# Patient Record
Sex: Female | Born: 1965 | Race: Black or African American | Hispanic: No | Marital: Married | Smoking: Never smoker
Health system: Southern US, Community
[De-identification: ages and names within clinical notes are randomized; demographics above are authoritative.]

## PROBLEM LIST (undated history)

## (undated) DIAGNOSIS — I10 Essential (primary) hypertension: Secondary | ICD-10-CM

## (undated) HISTORY — PX: TUBAL LIGATION: SHX77

## (undated) HISTORY — PX: DILATION AND CURETTAGE OF UTERUS: SHX78

---

## 2000-11-04 ENCOUNTER — Emergency Department (HOSPITAL_COMMUNITY): Admission: EM | Admit: 2000-11-04 | Discharge: 2000-11-04 | Payer: Self-pay

## 2000-11-04 ENCOUNTER — Encounter: Payer: Self-pay | Admitting: Emergency Medicine

## 2000-11-27 ENCOUNTER — Inpatient Hospital Stay (HOSPITAL_COMMUNITY): Admission: AD | Admit: 2000-11-27 | Discharge: 2000-11-27 | Payer: Self-pay | Admitting: Obstetrics

## 2000-11-27 ENCOUNTER — Encounter: Payer: Self-pay | Admitting: Obstetrics

## 2000-12-04 ENCOUNTER — Other Ambulatory Visit: Admission: RE | Admit: 2000-12-04 | Discharge: 2000-12-04 | Payer: Self-pay | Admitting: Obstetrics and Gynecology

## 2000-12-21 ENCOUNTER — Encounter: Payer: Self-pay | Admitting: Obstetrics and Gynecology

## 2000-12-21 ENCOUNTER — Ambulatory Visit (HOSPITAL_COMMUNITY): Admission: RE | Admit: 2000-12-21 | Discharge: 2000-12-21 | Payer: Self-pay | Admitting: Obstetrics and Gynecology

## 2001-01-25 ENCOUNTER — Encounter: Payer: Self-pay | Admitting: Obstetrics and Gynecology

## 2001-01-25 ENCOUNTER — Ambulatory Visit (HOSPITAL_COMMUNITY): Admission: RE | Admit: 2001-01-25 | Discharge: 2001-01-25 | Payer: Self-pay | Admitting: Obstetrics and Gynecology

## 2001-01-30 ENCOUNTER — Inpatient Hospital Stay (HOSPITAL_COMMUNITY): Admission: AD | Admit: 2001-01-30 | Discharge: 2001-01-30 | Payer: Self-pay | Admitting: Obstetrics and Gynecology

## 2001-01-31 ENCOUNTER — Encounter (INDEPENDENT_AMBULATORY_CARE_PROVIDER_SITE_OTHER): Payer: Self-pay | Admitting: Specialist

## 2001-01-31 ENCOUNTER — Encounter: Payer: Self-pay | Admitting: Obstetrics and Gynecology

## 2001-01-31 ENCOUNTER — Observation Stay (HOSPITAL_COMMUNITY): Admission: AD | Admit: 2001-01-31 | Discharge: 2001-02-01 | Payer: Self-pay | Admitting: Obstetrics and Gynecology

## 2001-02-24 ENCOUNTER — Emergency Department (HOSPITAL_COMMUNITY): Admission: EM | Admit: 2001-02-24 | Discharge: 2001-02-24 | Payer: Self-pay | Admitting: Emergency Medicine

## 2001-04-01 ENCOUNTER — Other Ambulatory Visit: Admission: RE | Admit: 2001-04-01 | Discharge: 2001-04-01 | Payer: Self-pay | Admitting: Obstetrics and Gynecology

## 2001-07-23 ENCOUNTER — Inpatient Hospital Stay (HOSPITAL_COMMUNITY): Admission: AD | Admit: 2001-07-23 | Discharge: 2001-07-23 | Payer: Self-pay | Admitting: Obstetrics and Gynecology

## 2001-07-23 ENCOUNTER — Encounter: Payer: Self-pay | Admitting: Obstetrics and Gynecology

## 2001-08-05 ENCOUNTER — Other Ambulatory Visit: Admission: RE | Admit: 2001-08-05 | Discharge: 2001-08-05 | Payer: Self-pay | Admitting: Obstetrics and Gynecology

## 2001-08-09 ENCOUNTER — Ambulatory Visit (HOSPITAL_COMMUNITY): Admission: RE | Admit: 2001-08-09 | Discharge: 2001-08-09 | Payer: Self-pay | Admitting: Obstetrics and Gynecology

## 2001-08-09 ENCOUNTER — Encounter: Payer: Self-pay | Admitting: Obstetrics and Gynecology

## 2001-08-12 ENCOUNTER — Observation Stay (HOSPITAL_COMMUNITY): Admission: RE | Admit: 2001-08-12 | Discharge: 2001-08-13 | Payer: Self-pay | Admitting: Obstetrics and Gynecology

## 2001-09-07 ENCOUNTER — Ambulatory Visit (HOSPITAL_COMMUNITY): Admission: RE | Admit: 2001-09-07 | Discharge: 2001-09-07 | Payer: Self-pay | Admitting: Obstetrics and Gynecology

## 2001-09-07 ENCOUNTER — Encounter: Payer: Self-pay | Admitting: Obstetrics and Gynecology

## 2001-09-22 ENCOUNTER — Ambulatory Visit (HOSPITAL_COMMUNITY): Admission: RE | Admit: 2001-09-22 | Discharge: 2001-09-22 | Payer: Self-pay | Admitting: Obstetrics and Gynecology

## 2001-09-22 ENCOUNTER — Encounter: Payer: Self-pay | Admitting: Obstetrics and Gynecology

## 2001-10-20 ENCOUNTER — Ambulatory Visit (HOSPITAL_COMMUNITY): Admission: RE | Admit: 2001-10-20 | Discharge: 2001-10-20 | Payer: Self-pay | Admitting: Obstetrics and Gynecology

## 2001-10-20 ENCOUNTER — Encounter: Payer: Self-pay | Admitting: Obstetrics and Gynecology

## 2001-11-11 ENCOUNTER — Encounter: Payer: Self-pay | Admitting: Obstetrics and Gynecology

## 2001-11-11 ENCOUNTER — Ambulatory Visit (HOSPITAL_COMMUNITY): Admission: RE | Admit: 2001-11-11 | Discharge: 2001-11-11 | Payer: Self-pay | Admitting: Obstetrics and Gynecology

## 2001-12-05 ENCOUNTER — Inpatient Hospital Stay (HOSPITAL_COMMUNITY): Admission: AD | Admit: 2001-12-05 | Discharge: 2001-12-05 | Payer: Self-pay | Admitting: Obstetrics and Gynecology

## 2001-12-09 ENCOUNTER — Inpatient Hospital Stay (HOSPITAL_COMMUNITY): Admission: AD | Admit: 2001-12-09 | Discharge: 2001-12-09 | Payer: Self-pay | Admitting: Obstetrics and Gynecology

## 2001-12-20 ENCOUNTER — Encounter: Payer: Self-pay | Admitting: Obstetrics and Gynecology

## 2001-12-20 ENCOUNTER — Ambulatory Visit (HOSPITAL_COMMUNITY): Admission: RE | Admit: 2001-12-20 | Discharge: 2001-12-20 | Payer: Self-pay | Admitting: Obstetrics and Gynecology

## 2001-12-21 ENCOUNTER — Inpatient Hospital Stay (HOSPITAL_COMMUNITY): Admission: AD | Admit: 2001-12-21 | Discharge: 2001-12-21 | Payer: Self-pay | Admitting: Obstetrics and Gynecology

## 2001-12-22 ENCOUNTER — Encounter: Admission: RE | Admit: 2001-12-22 | Discharge: 2001-12-22 | Payer: Self-pay | Admitting: Obstetrics and Gynecology

## 2001-12-24 ENCOUNTER — Inpatient Hospital Stay (HOSPITAL_COMMUNITY): Admission: AD | Admit: 2001-12-24 | Discharge: 2001-12-24 | Payer: Self-pay | Admitting: Obstetrics and Gynecology

## 2001-12-28 ENCOUNTER — Inpatient Hospital Stay (HOSPITAL_COMMUNITY): Admission: AD | Admit: 2001-12-28 | Discharge: 2001-12-31 | Payer: Self-pay | Admitting: Obstetrics and Gynecology

## 2002-01-04 ENCOUNTER — Inpatient Hospital Stay (HOSPITAL_COMMUNITY): Admission: AD | Admit: 2002-01-04 | Discharge: 2002-01-04 | Payer: Self-pay | Admitting: Obstetrics and Gynecology

## 2002-03-03 ENCOUNTER — Emergency Department (HOSPITAL_COMMUNITY): Admission: EM | Admit: 2002-03-03 | Discharge: 2002-03-03 | Payer: Self-pay | Admitting: Emergency Medicine

## 2002-12-06 ENCOUNTER — Other Ambulatory Visit: Admission: RE | Admit: 2002-12-06 | Discharge: 2002-12-06 | Payer: Self-pay | Admitting: Family Medicine

## 2004-03-27 ENCOUNTER — Encounter: Admission: RE | Admit: 2004-03-27 | Discharge: 2004-03-27 | Payer: Self-pay | Admitting: Family Medicine

## 2004-06-21 ENCOUNTER — Emergency Department (HOSPITAL_COMMUNITY): Admission: EM | Admit: 2004-06-21 | Discharge: 2004-06-21 | Payer: Self-pay | Admitting: Emergency Medicine

## 2005-06-21 ENCOUNTER — Emergency Department (HOSPITAL_COMMUNITY): Admission: EM | Admit: 2005-06-21 | Discharge: 2005-06-21 | Payer: Self-pay | Admitting: Emergency Medicine

## 2005-08-21 ENCOUNTER — Ambulatory Visit (HOSPITAL_COMMUNITY): Admission: RE | Admit: 2005-08-21 | Discharge: 2005-08-21 | Payer: Self-pay | Admitting: Family Medicine

## 2005-12-05 ENCOUNTER — Ambulatory Visit: Payer: Self-pay | Admitting: Family Medicine

## 2006-01-29 ENCOUNTER — Ambulatory Visit: Payer: Self-pay | Admitting: Internal Medicine

## 2006-02-04 ENCOUNTER — Emergency Department (HOSPITAL_COMMUNITY): Admission: EM | Admit: 2006-02-04 | Discharge: 2006-02-04 | Payer: Self-pay | Admitting: Emergency Medicine

## 2006-02-17 ENCOUNTER — Ambulatory Visit (HOSPITAL_COMMUNITY): Admission: RE | Admit: 2006-02-17 | Discharge: 2006-02-17 | Payer: Self-pay | Admitting: Emergency Medicine

## 2006-03-17 ENCOUNTER — Ambulatory Visit: Payer: Self-pay | Admitting: Family Medicine

## 2006-05-26 ENCOUNTER — Inpatient Hospital Stay (HOSPITAL_COMMUNITY): Admission: EM | Admit: 2006-05-26 | Discharge: 2006-05-27 | Payer: Self-pay | Admitting: Emergency Medicine

## 2006-05-27 ENCOUNTER — Encounter: Payer: Self-pay | Admitting: Cardiovascular Disease

## 2006-09-01 ENCOUNTER — Emergency Department (HOSPITAL_COMMUNITY): Admission: EM | Admit: 2006-09-01 | Discharge: 2006-09-01 | Payer: Self-pay | Admitting: Emergency Medicine

## 2006-10-23 ENCOUNTER — Ambulatory Visit (HOSPITAL_COMMUNITY): Admission: RE | Admit: 2006-10-23 | Discharge: 2006-10-23 | Payer: Self-pay | Admitting: Obstetrics

## 2007-04-19 ENCOUNTER — Ambulatory Visit (HOSPITAL_COMMUNITY): Admission: RE | Admit: 2007-04-19 | Discharge: 2007-04-19 | Payer: Self-pay | Admitting: Obstetrics

## 2007-04-27 ENCOUNTER — Ambulatory Visit (HOSPITAL_COMMUNITY): Admission: RE | Admit: 2007-04-27 | Discharge: 2007-04-27 | Payer: Self-pay | Admitting: Obstetrics

## 2007-05-04 ENCOUNTER — Ambulatory Visit (HOSPITAL_COMMUNITY): Admission: RE | Admit: 2007-05-04 | Discharge: 2007-05-04 | Payer: Self-pay | Admitting: Obstetrics

## 2007-05-12 ENCOUNTER — Ambulatory Visit (HOSPITAL_COMMUNITY): Admission: RE | Admit: 2007-05-12 | Discharge: 2007-05-12 | Payer: Self-pay | Admitting: Obstetrics

## 2007-05-18 ENCOUNTER — Ambulatory Visit (HOSPITAL_COMMUNITY): Admission: RE | Admit: 2007-05-18 | Discharge: 2007-05-18 | Payer: Self-pay | Admitting: Obstetrics

## 2007-06-01 ENCOUNTER — Ambulatory Visit (HOSPITAL_COMMUNITY): Admission: RE | Admit: 2007-06-01 | Discharge: 2007-06-01 | Payer: Self-pay | Admitting: Obstetrics

## 2007-06-08 ENCOUNTER — Ambulatory Visit (HOSPITAL_COMMUNITY): Admission: RE | Admit: 2007-06-08 | Discharge: 2007-06-08 | Payer: Self-pay | Admitting: Obstetrics

## 2007-07-30 ENCOUNTER — Inpatient Hospital Stay (HOSPITAL_COMMUNITY): Admission: AD | Admit: 2007-07-30 | Discharge: 2007-08-03 | Payer: Self-pay | Admitting: Obstetrics

## 2007-07-31 ENCOUNTER — Encounter: Payer: Self-pay | Admitting: Obstetrics

## 2007-10-09 ENCOUNTER — Emergency Department (HOSPITAL_COMMUNITY): Admission: EM | Admit: 2007-10-09 | Discharge: 2007-10-09 | Payer: Self-pay | Admitting: Emergency Medicine

## 2007-12-08 ENCOUNTER — Inpatient Hospital Stay (HOSPITAL_COMMUNITY): Admission: AD | Admit: 2007-12-08 | Discharge: 2007-12-08 | Payer: Self-pay | Admitting: Obstetrics

## 2008-05-08 ENCOUNTER — Ambulatory Visit (HOSPITAL_COMMUNITY): Admission: RE | Admit: 2008-05-08 | Discharge: 2008-05-08 | Payer: Self-pay | Admitting: Obstetrics

## 2008-06-06 ENCOUNTER — Emergency Department (HOSPITAL_COMMUNITY): Admission: EM | Admit: 2008-06-06 | Discharge: 2008-06-06 | Payer: Self-pay | Admitting: Emergency Medicine

## 2008-07-27 ENCOUNTER — Encounter: Admission: RE | Admit: 2008-07-27 | Discharge: 2008-07-27 | Payer: Self-pay | Admitting: Internal Medicine

## 2008-09-16 ENCOUNTER — Inpatient Hospital Stay (HOSPITAL_COMMUNITY): Admission: AD | Admit: 2008-09-16 | Discharge: 2008-09-16 | Payer: Self-pay | Admitting: Obstetrics & Gynecology

## 2008-10-06 ENCOUNTER — Other Ambulatory Visit: Admission: RE | Admit: 2008-10-06 | Discharge: 2008-10-06 | Payer: Self-pay | Admitting: Internal Medicine

## 2008-12-29 ENCOUNTER — Ambulatory Visit: Payer: Self-pay | Admitting: *Deleted

## 2009-01-15 ENCOUNTER — Encounter (INDEPENDENT_AMBULATORY_CARE_PROVIDER_SITE_OTHER): Payer: Self-pay | Admitting: Internal Medicine

## 2009-01-15 ENCOUNTER — Ambulatory Visit: Payer: Self-pay | Admitting: Family Medicine

## 2009-01-15 LAB — CONVERTED CEMR LAB
BUN: 14 mg/dL (ref 6–23)
Chloride: 105 meq/L (ref 96–112)
Glucose, Bld: 91 mg/dL (ref 70–99)
Potassium: 4 meq/L (ref 3.5–5.3)

## 2009-03-23 ENCOUNTER — Emergency Department (HOSPITAL_COMMUNITY): Admission: EM | Admit: 2009-03-23 | Discharge: 2009-03-23 | Payer: Self-pay | Admitting: Family Medicine

## 2009-10-09 ENCOUNTER — Ambulatory Visit (HOSPITAL_COMMUNITY): Admission: RE | Admit: 2009-10-09 | Discharge: 2009-10-09 | Payer: Self-pay | Admitting: Internal Medicine

## 2009-12-02 IMAGING — US US OB TRANSVAGINAL
1 series · 14 of 28 positions shown · non-contrast
Comparison: none

OBSTETRICAL ULTRASOUND:
 This ultrasound was performed in The [HOSPITAL], and the [facility] report will be stored to [REDACTED] PACS.

[Series 1: us ob transvaginal · 14 of 32 slices shown]
[im 2/32]
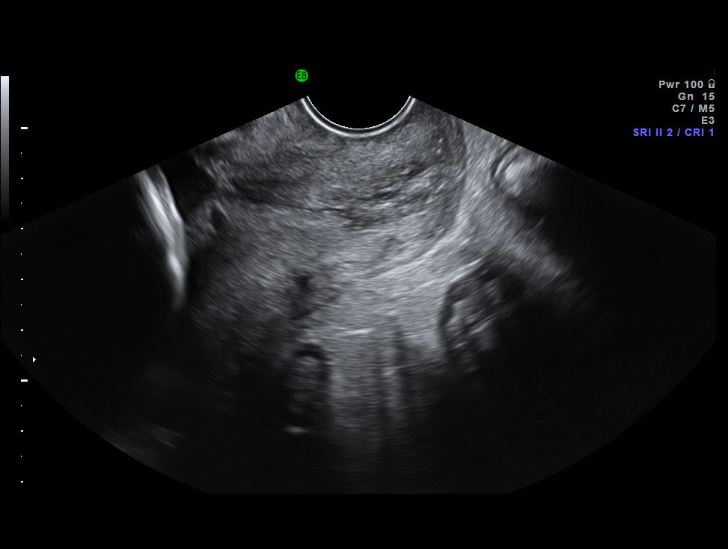
[im 4/32]
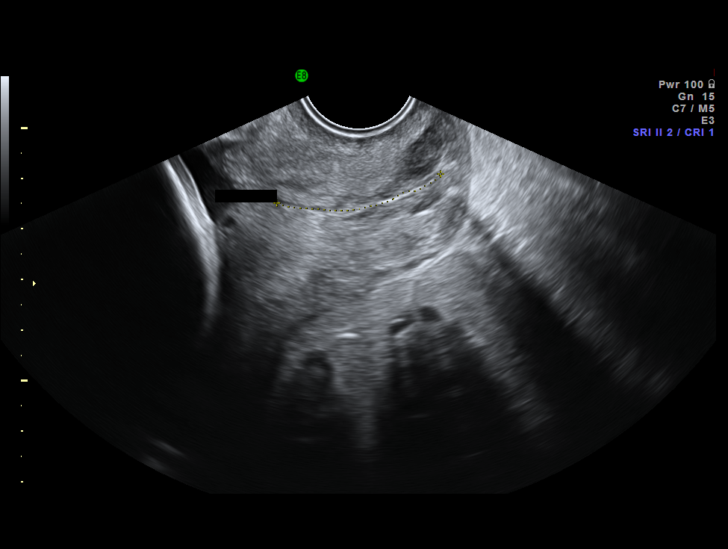
[im 6/32]
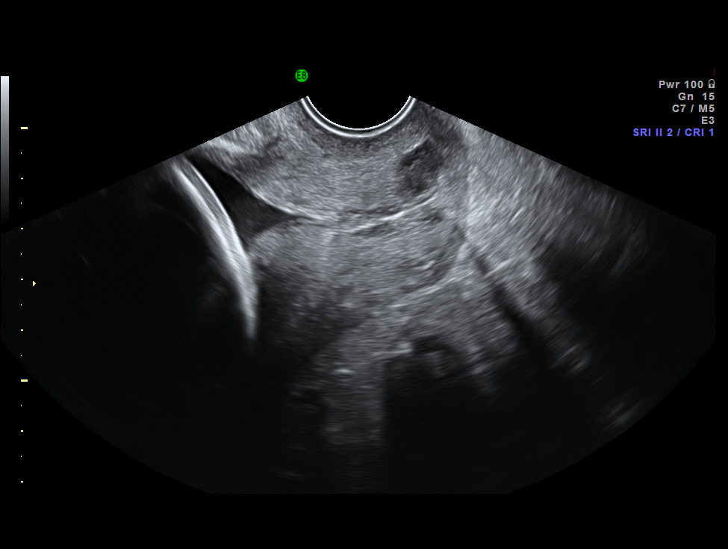
[im 9/32]
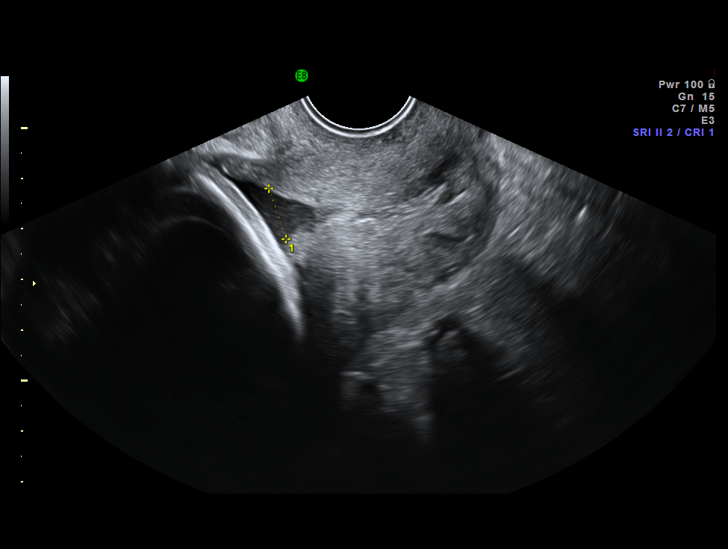
[im 11/32]
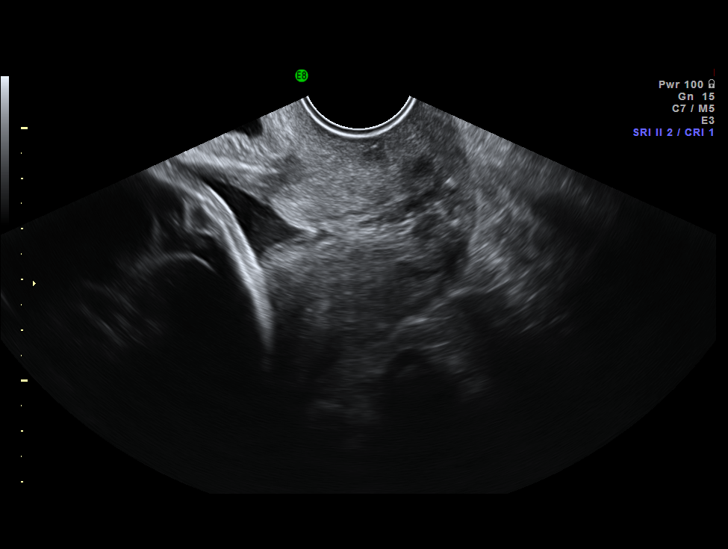
[im 13/32]
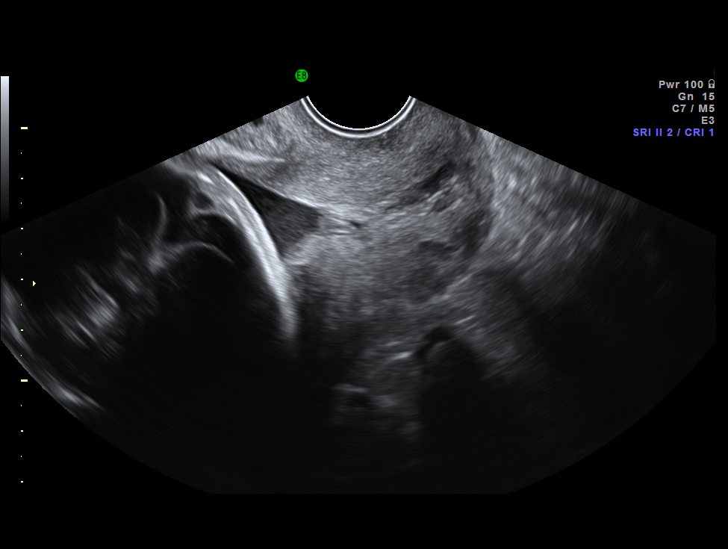
[im 15/32]
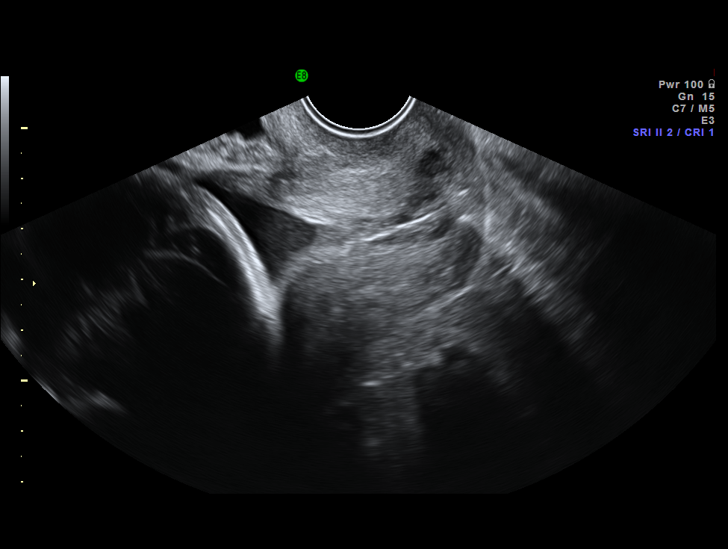
[im 18/32]
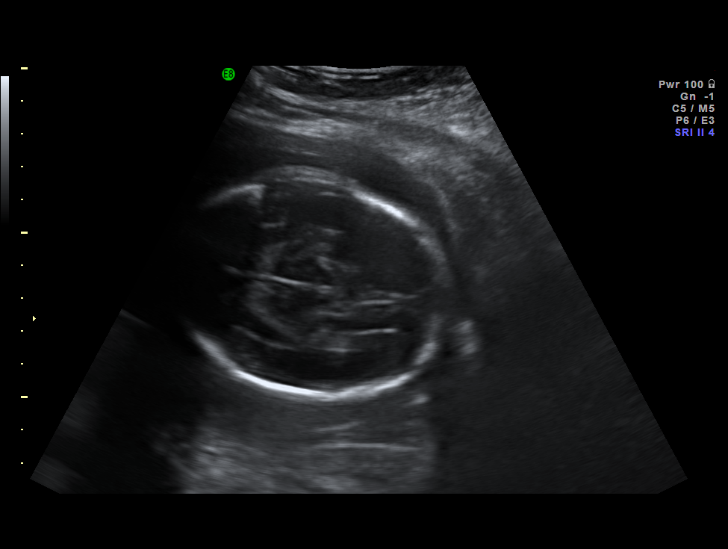
[im 20/32]
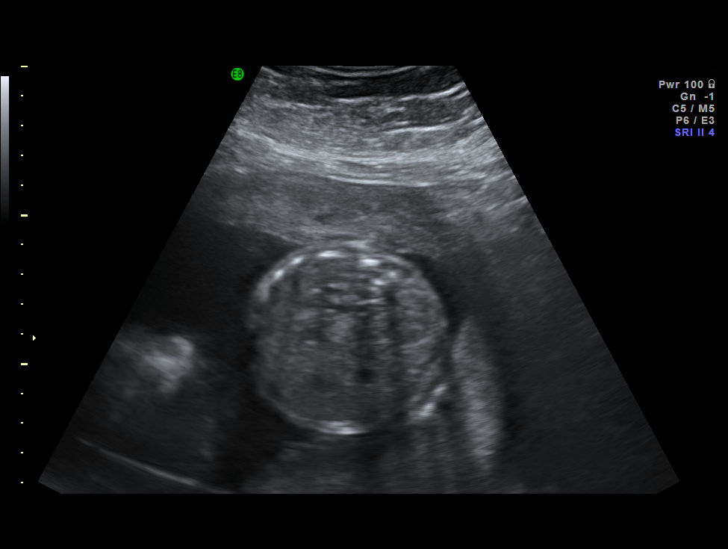
[im 22/32]
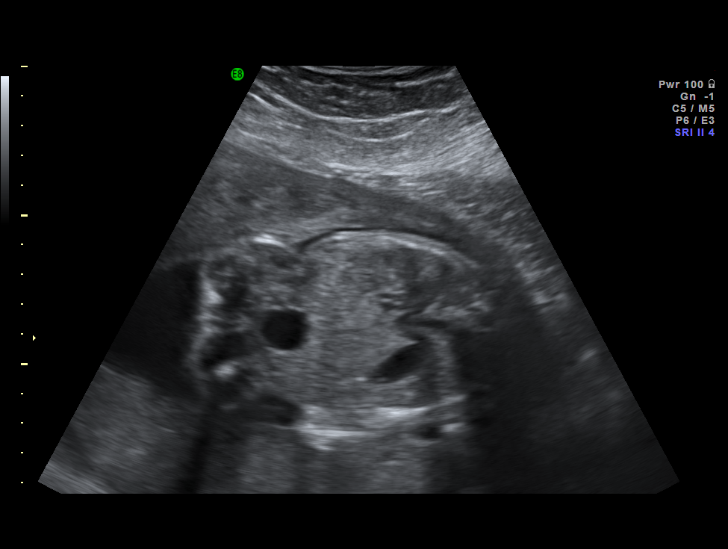
[im 25/32]
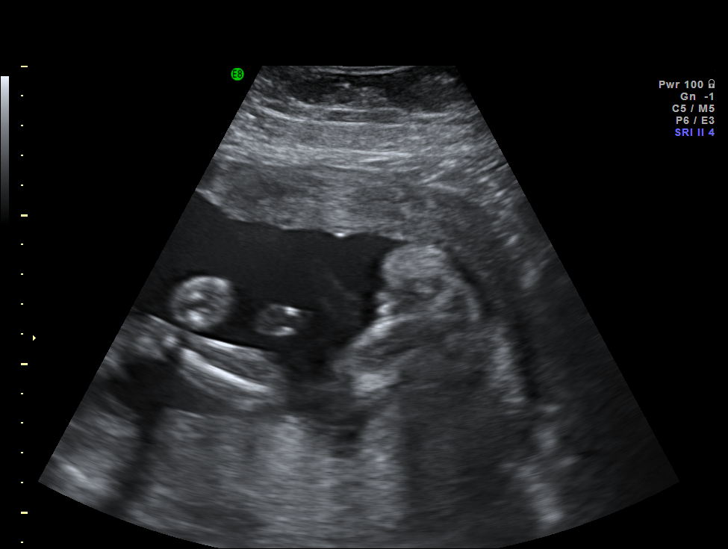
[im 27/32]
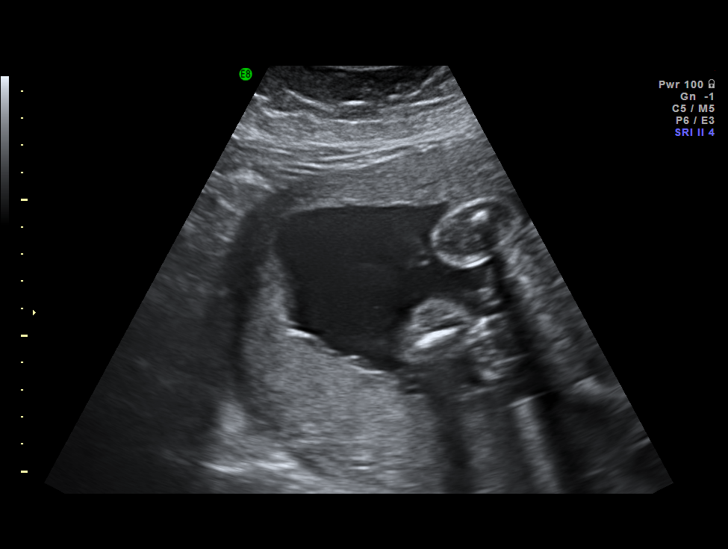
[im 29/32]
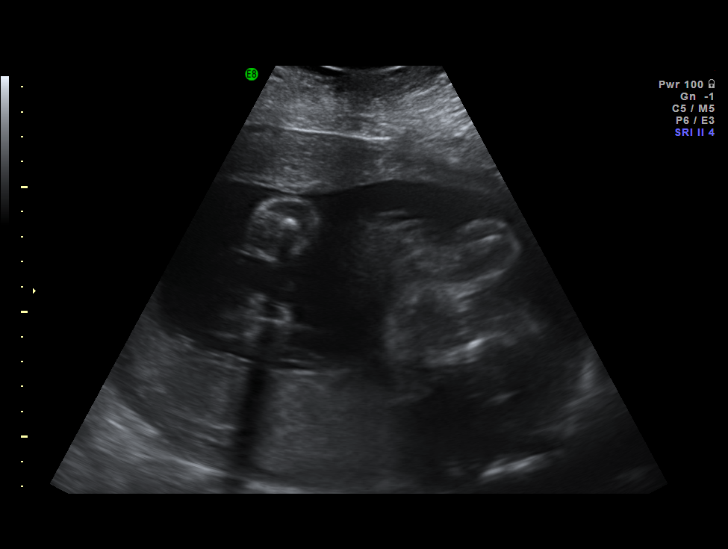
[im 32/32]
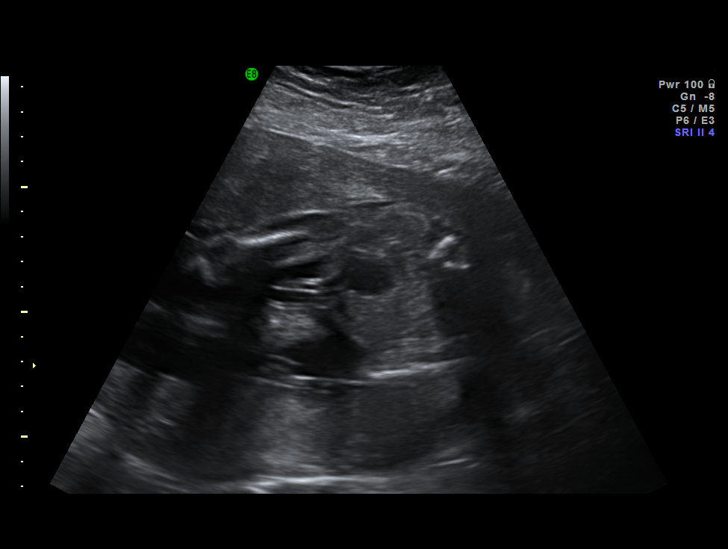

[14 of 28 positions shown; findings below may reference images not displayed]

IMPRESSION: The [facility] report has also been faxed to the ordering physician.

## 2010-04-16 ENCOUNTER — Inpatient Hospital Stay (HOSPITAL_COMMUNITY)
Admission: AD | Admit: 2010-04-16 | Discharge: 2010-04-16 | Payer: Self-pay | Source: Home / Self Care | Attending: Obstetrics & Gynecology | Admitting: Obstetrics & Gynecology

## 2010-05-05 ENCOUNTER — Encounter: Payer: Self-pay | Admitting: Obstetrics

## 2010-05-06 ENCOUNTER — Other Ambulatory Visit: Payer: Self-pay

## 2010-05-06 ENCOUNTER — Ambulatory Visit
Admission: RE | Admit: 2010-05-06 | Discharge: 2010-05-06 | Payer: Self-pay | Source: Home / Self Care | Attending: Obstetrics & Gynecology | Admitting: Obstetrics & Gynecology

## 2010-05-06 ENCOUNTER — Encounter: Payer: Self-pay | Admitting: Obstetrics

## 2010-05-07 NOTE — Progress Notes (Unsigned)
NAMEJENENE, [name]            ACCOUNT NO.:  [id number]  MEDICAL RECORD NO.:  [id number]          PATIENT TYPE:  WOC  LOCATION:  [facility]                   FACILITY:  [facility]  PHYSICIAN:  Wynelle Bourgeois, CNM    DATE OF BIRTH:  Jun 25, 1965  DATE OF SERVICE:  05/06/2010                                 CLINIC NOTE  HISTORY OF PRESENT ILLNESS:  The patient is a 45 year old G5, P2-0-3-2 who presents today for routine well-woman exam after being seen in the MAU on April 16, 2010, to rule out pregnancy.  The patient's last period was April 09, 2010, it was normal.  She is status post BTL after her last pregnancy which was 2 years ago.  Today, the patient does endorse some lower extremity edema that began after she stopped taking her HCTZ approximately 1 month ago, otherwise she has no complaints today.  HEALTH MAINTENANCE:  Last mammogram was in 2011, and per patient it was normal.  Last Pap smear was in June of 2010, per the patient she has had no abnormal Pap smears in the past.  PAST MEDICAL HISTORY:  Significant for hypertension, previously treated with HCTZ, but has not taken medicine approximately one month due to health concern status.  PAST GYNECOLOGICAL HISTORY:  No history of STDs, no history of abnormal Pap smears.  PAST OBSTETRICAL HISTORY:  Patient has had 2 C-sections.  She has also had 2 cerclages for cervical incompetence.  Her two living children were both born at term.  PAST SURGICAL HISTORY:  Cesarean section x2.  FAMILY HISTORY:  Positive for hypertension in mother.  SOCIAL HISTORY:  No smoking, alcohol, or drug use.  Patient is currently unemployed, but hoping to start a new job __________.  PHYSICAL EXAMINATION:  VITAL SIGNS:  Temperature 99.5, pulse 87, blood pressure 129/85, weight 255.6 pounds. CARDIOVASCULAR:  Normal sinus rate and rhythm.  No murmurs. PULMONARY:  Clear to auscultation bilaterally. ABDOMEN:  Soft, nontender.  No masses.  Positive  for bowel sounds. BREASTS:  No external lesions noted.  No masses noted. SPECULUM EXAM:  Normal external genitalia.  Cervix normal without discharge. PELVIC:  Revealed no masses or tenderness. EXTREMITIES:  Lower extremities, minimal edema bilaterally.  Pedal pulses present bilaterally.  ASSESSMENT:  A 45 year old G5, P2-0-3-2, presents today for annual exam.  PLAN:  Pap smear completed at today's visit.  Prescription written for 1 month 25 mg of hydrochlorothiazide.  Advised the patient to contact [facility] for primary care management.  She may return here in 1 year for routine exam or sooner if she has any problems.  We also counseled the patient regarding her recent MAU visit and her concern that she might have been pregnant, although the urine pregnancy test was negative in the MAU.  We reassured the patient that she could continue to take at home urine pregnancy test once in a month if she was still worried that she may be pregnant, but given her history of bilateral tubal ligation and recent normal periods, the likelihood of pregnancy is very low.    ______________________________ Lubertha Basque, PA student   ______________________________ Wynelle Bourgeois, CNM   AG/MEDQ  D:  05/06/2010  T:  05/07/2010  Job:  [id number]

## 2010-06-24 LAB — POCT PREGNANCY, URINE: Preg Test, Ur: NEGATIVE

## 2010-06-24 LAB — URINALYSIS, ROUTINE W REFLEX MICROSCOPIC
Glucose, UA: NEGATIVE mg/dL
Ketones, ur: NEGATIVE mg/dL
Protein, ur: NEGATIVE mg/dL

## 2010-06-24 LAB — URINE MICROSCOPIC-ADD ON

## 2010-08-27 NOTE — Discharge Summary (Signed)
[name], [name]            ACCOUNT NO.:  [id number]   MEDICAL RECORD NO.:  [id number]          PATIENT TYPE:  INP   LOCATION:  9120                          [facility]   PHYSICIAN:  Charles A. Clearance Coots, M.D.DATE OF BIRTH:  31-May-1965   DATE OF ADMISSION:  07/30/2007  DATE OF DISCHARGE:  08/03/2007                               DISCHARGE SUMMARY   ADMITTING DIAGNOSES:  1. A 39 weeks' gestation, active labor.  2. Previous cesarean section.  3. Desired trial of labor.  4. Desired permanent sterilization.   DISCHARGE DIAGNOSES:  1. A 39 weeks' gestation, active labor.  2. Previous cesarean section.  3. Desired trial of labor.  4. Desired permanent sterilization.  5. Status post repeat low transverse cesarean section and bilateral      partial salpingectomy on July 31, 2007, for persistent late fetal      heart rate decelerations.  Viable female was delivered at 0746.      Apgars of 8 at 1 minute, 9 at 5 minutes, weight of 9 pounds 12      ounces.  Cord pH of 7.19, length of 52.83 cm.  Mother and infant      discharged to home in good condition.   REASON FOR ADMISSION:  A 45 year old G5, P61, black female, estimated  date of confinement August 06, 2007, presents with uterine contractions.  The patient has a history of cervical incompetence and was on  progesterone therapy until 35 weeks' gestation.  She presented with  complaint of uterine contractions.   PAST MEDICAL HISTORY:  Surgery: Cesarean section.  Illnesses:  None.   MEDICATIONS:  Prenatal vitamins and Delalutin.   ALLERGIES:  No known drug allergies.   SOCIAL HISTORY:  Married.  Negative tobacco, alcohol, or recreational  drug use.   PHYSICAL EXAMINATION:  GENERAL:  Well-nourished, well-developed female  in no acute distress, afebrile.  VITAL SIGNS:  Stable.  LUNGS:  Clear to auscultation bilaterally.  HEART:  Regular rate and rhythm.  ABDOMEN:  Gravid, nontender.  Cervix 5 cm dilated, 90% effacement  and  vertex at -2 station.   ADMITTING LABORATORY VALUES:  Hemoglobin 12, hematocrit 34.7, white  blood cell count 5000, platelets 220,000.  RPR was nonreactive.   HOSPITAL COURSE:  The patient was admitted and had no progress in labor  for greater than 3 hours of active labor, but the fetal heart rate  revealed persistent fetal heart rate, late onset decelerations that  became worse over time despite intrauterine pressure catheter and  amnioinfusion oxygen and position changes.  The patient was having  adequate labor by intrauterine pressure catheter measurements, but was  making no progress in labor with a very high station.  A decision was  made to proceed with repeat cesarean section delivery for persistent  late fetal heart rate decelerations.  The patient desired permanent  sterilization, repeat low transverse cesarean section and bilateral  partial salpingectomy was performed on July 31, 2007.  There were no  intraoperative complications.  Postoperative course was uncomplicated.  The patient was discharged home on postop day #3 in good condition.  DISCHARGE LABS:  Hemoglobin 10, hematocrit 30, white blood cell count  9,800, platelets 202,000.   DISCHARGE DISPOSITION/MEDICATIONS:  Tylox and ibuprofen was prescribed  for pain.  Continue prenatal vitamins.  Routine written instructions  were given for discharge after cesarean section.  The patient is to call  office for a followup appointment in 2 weeks.      Charles A. Clearance Coots, M.D.  Electronically Signed     CAH/MEDQ  D:  08/03/2007  T:  08/03/2007  Job:  [id number]

## 2010-08-27 NOTE — Op Note (Signed)
[name], [name]            ACCOUNT NO.:  [id number]   MEDICAL RECORD NO.:  [id number]          PATIENT TYPE:  INP   LOCATION:  9120                          [facility]   PHYSICIAN:  Charles A. Clearance Coots, M.D.DATE OF BIRTH:  1966-02-16   DATE OF PROCEDURE:  07/31/2007  DATE OF DISCHARGE:                               OPERATIVE REPORT   PREOPERATIVE DIAGNOSIS:  Persistent late decels, previous cesarean  section, desires sterilization.   POSTOPERATIVE DIAGNOSIS:  Persistent late decels, previous cesarean  section, desires sterilization.   PROCEDURE:  Repeat low transverse cesarean section and bilateral partial  salpingectomy.   SURGEON:  Brock Bad, MD   ASSISTANT:  Lysle Pearl, OR tech.   ANESTHESIA:  Epidural.   ESTIMATED BLOOD LOSS:  1000 mL.   IV FLUIDS:  1600 mL.   URINE OUTPUT:  50 mL clear.   COMPLICATIONS:  None.   DRAINS:  Foley to gravity.   FINDINGS:  Viable female at 0746 hours.  Apgars of 8 at one minute and 9  at five minutes.  Weight of 9 pounds and 12 ounces.  Cord pH of 7.19.  Normal uterus, ovaries and fallopian tubes.   SPECIMEN:  Approximately 2 cm segments of right and left fallopian  tubes.   DISPOSITION OF SPECIMEN:  Pathology.   PROCEDURE IN DETAIL:  The patient was brought to the operating room  after satisfactory redosing of the epidural the abdomen was prepped and  draped in the usual sterile fashion.  A Pfannenstiel skin incision was  made through the previous scar with a scalpel down to the fascia.  The  fascia was nicked in the midline.  The fascial incision was extended to  the left and to the right with curved Mayo scissors.  The superior and  inferior fascial edges were taken off of the rectus muscles both blunt  and sharp dissection.  The rectus muscle was then divided bluntly in the  midline.  The peritoneum was entered digitally and was digitally  extended to the left and to the right.  The Alexis retractor was then  placed in the incision and the vesicouterine fold of peritoneum was  grasped with forceps and was incised and undermined with Metzenbaum  scissors.  The incision was extended to the left and to the right with  the Metzenbaum scissors.  The bladder flap was then bluntly developed  and the uterus was then entered transversely in the lower uterine  segment with a scalpel.  Clear amniotic fluid was expelled.  The uterine  incision was then extended to the left and to the right with the bandage  scissors.  The vertex was then brought up into the incision and the  occiput was rotated anteriorly and the vertex was then delivered with  the aid of fundal pressure from the assistant.  The infant's mouth and  nose were suctioned with a suction bulb and the delivery was completed  with the aid of fundal pressure from the assistant.  The umbilical cord  was doubly clamped and cut and the infant was handed off to the nursery  staff.  The placenta was spontaneously expelled from the uterine cavity  intact.  The endometrial surface was thoroughly debrided with a dry lap  sponge.  The edges of the uterine incision were then grasped with ring  forceps and the uterus was closed with a continuous interlocking suture  of zero Monocryl from each corner to the center.  Hemostasis was  excellent.   Attention was then turned to the tubal ligation procedure.  The left  fallopian tube was grasped with a Babcock clamp and was identified from  the corneal end to the fimbrial end and then grasped with a Babcock  clamp.  The knuckle of tube beneath the Babcock clamp in the isthmic  area of the tube was then suture ligated with zero plain catgut and the  section of tube above the knot was excised with Metzenbaum scissors and  submitted to pathology for evaluation.  There was no active bleeding  from the tubal stump and it was therefore placed back in its normal  anatomic position.  The same procedure was performed on  the opposite  side without complications.  The abdomen was then closed as follows:  The peritoneum was closed with a continuous suture of zero Monocryl.  The muscle was approximated with a continuous suture of zero Monocryl.  The fascia was closed with a continuous suture of zero PDS from each  corner to the center.  Subcutaneous tissue was thoroughly irrigated with  warm saline solution.  All areas of subcutaneous bleeding were  coagulated with the Bovie.  The subcutaneous tissue was then  approximated with a continuous suture of zero plain catgut.  The skin  was then closed with a continuous subcuticular suture of 3-0 Monocryl.  A sterile bandage was applied to the incision closure.  The surgical  technician indicated that all needle, sponge and instrument counts were  correct x2.  The patient tolerated the procedure well and was  transported to the recovery room in satisfactory condition.      Charles A. Clearance Coots, M.D.  Electronically Signed     CAH/MEDQ  D:  07/31/2007  T:  07/31/2007  Job:  [id number]

## 2010-08-30 NOTE — H&P (Signed)
[facility]  Patient:    [name], [name] Visit Number: [id number] MRN: [id number]          Service Type: MED Location: 9300 [id number] Attending Physician:  Shaune Spittle Dictated by:   Mack Guise, C.N.M. Admit Date:  01/31/2001 Discharge Date: 02/01/2001                           History and Physical  HISTORY OF PRESENT ILLNESS:   Ms. Brossard is a 45 year old, gravida 3, para 0-0-2-0, who presents at 19 weeks with spontaneous AB. She was seen in the Maternity Admissions Unit on yesterday, October 19, following rupture of membranes at approximately 1615 for clear fluid. She did not labor at that time. Ultrasound evaluation done yesterday, October 19, found a 19-week pregnancy with leaking fluid. The baby did have positive cardiac activity with fetal heart rate at 137 beats per minute. The cervix measured 3.2 cm and there was no discernible, no measurable amniotic fluid. Discussion was entered into with the patient regarding the inevitable threatened AB and patient and spouse did make the decision to go home and proceed with expectant management. She presents today with bleeding and cramping which started at approximately 1545. There is no discernible heart beat today and on speculum exam, the babys feet are noted at the cervical os.  The patients pregnancy has been followed by the MD service at [facility] and is remarkable for a history of two second trimester losses, one at 16 weeks and one at 22 weeks.  LABORATORY DATA:              Prenatal lab work on December 04, 2000 found hemoglobin and hematocrit 11.9 and 34.5. Today, hemoglobin and hematocrit are 11.2 and 32.3. Platelets on prenatal lab work were 243,000 and today they are 274,000. Todays white blood cell count is 8.8. Hepatitis B surface antigen is negative. Rubella immune. RPR is nonreactive. Blood type is B positive, antibody screen negative, sickle cell trait negative and  HIV nonreactive. Comprehensive metabolic panel done today finds sodium 138, potassium 3.6, chloride 103, carbon dioxide 25, glucose 103, BUN 8, creatinine 0.7, calcium 9.1, total protein 7.6, albumin 3.2, SGOT 21, SGPT 14, alkaline phosphatase 57, and total bilirubin 0.3.  PAST MEDICAL HISTORY:         Unremarkable.  OBSTETRICAL HISTORY:          Significant for two second trimester losses, one at 16 weeks and one at 22 weeks, in 1983 and 2001 with D&E performed following both of those. She does not have any significant medical history nor any surgical history.  FAMILY HISTORY:               Unremarkable.  SOCIAL HISTORY:               Husband is present and supportive. She denies the use of tobacco, alcohol, or illicit drugs.  ALLERGIES:                    No known drug allergies.  REVIEW OF SYSTEMS:            Patient is visibly upset and crying.  Bleeding and cramping are noted. The patient is miscarrying as described above.  PHYSICAL EXAMINATION:  VITAL SIGNS:                  Vital signs are stable. Temperature 97.3, pulse 102, respirations 24, and  blood pressure 144/68.  HEENT:                        Unremarkable.  NECK:                         Thyroid not enlarged.  HEART:                        Regular rate and rhythm.  LUNGS:                        Clear.  ABDOMEN:                      Soft and nontender in between contractions. There are no fetal heart tones.  PELVIC:                       On speculum exam, a small amount of blood and clots are noted in the vagina and fetal feet are noted at the cervical os which does appear closed.  ASSESSMENT:                   1. Intrauterine pregnancy at 19 weeks.                               2. Spontaneous incomplete abortion.  PLAN:                         Admit per Dr. Pennie Rushing and Dr. Pennie Rushing to follow. ictated by:   Mack Guise, C.N.M. Attending Physician:  Shaune Spittle DD:  01/31/01 TD:   02/01/01 Job: [id number]

## 2010-08-30 NOTE — Discharge Summary (Signed)
[facility]  Patient:    [name], [name] Visit Number: [id number] MRN: [id number]          Service Type: MED Location: [phone] Attending Physician:  Shaune Spittle Dictated by:   Saverio Danker, C.N.M. Admit Date:  01/31/2001 Discharge Date: 02/01/2001                             Discharge Summary  ADMISSION DIAGNOSES:          1. Intrauterine pregnancy at 19 weeks with                                  spontaneous rupture of the membranes on                                  January 30, 2001.                               2. Complete spontaneous abortion.                               3. Diagnosis of incompetent cervix by                                  obstetrical history.  DISCHARGE DIAGNOSES:          1. Intrauterine pregnancy at 19 weeks with                                  spontaneous rupture of membranes on                                  January 30, 2001.                               2. Complete spontaneous abortion.                               3. Diagnosis of incompetent cervix by                                  obstetrical history.                               4. Desires conception as soon as possible.  PROCEDURES THIS ADMISSION:    Spontaneous delivery of a 19 week nonviable fetus with spontaneous delivery of placenta also.  This delivery was attended by Dr. Dierdre Forth on January 31, 2001 with no immediate complications.  HOSPITAL COURSE:              [name] is a 45 year old, Gravida 3, Para 0-0-2-0 with history of two previous second trimester loses who presented with spontaneous rupture of the membranes on January 30, 2001  and elected at that time to go home and follow expectant management. She returns on January 31, 2001 complaining of contractions and vaginal spotting and bleeding and was noted to be in the process of miscarrying a 19 week fetus.  She completed the delivery at  approximately early evening of January 31, 2001 attended by Dr. Dierdre Forth.  The fetus was nonviable and appeared consistent with approximately [redacted] weeks gestation.  Her vital signs are stable.  She is afebrile.  Her bleeding is minimal and she is deemed ready for discharge on post delivery day one.  DISCHARGE INSTRUCTIONS:       She is to call for fever greater than 100.4 or for significant vaginal bleeding or as needed.  DISCHARGE MEDICATIONS:        1. Motrin 600 mg p.o. q.6h. p.r.n. pain.                               2. Prenatal vitamins daily.  DISCHARGE LABORATORY DATA:    Her hemoglobin is 10.1, WBC 8.1, platelets 241. She had an EKG that shows normal sinus rhythm.  DISCHARGE FOLLOW UP:          Will be in approximately two weeks at [facility] or p.r.n. Dictated by:   Vance Gather Duplantis, C.N.M. Attending Physician:  Shaune Spittle DD:  02/01/01 TD:  02/03/01 Job: [id number]

## 2010-08-30 NOTE — Op Note (Signed)
[facility]  Patient:    [name], [name] Visit Number: [id number] MRN: [id number]          Service Type: DSU Location: 9300 [id number] Attending Physician:  Jaymes Graff A Dictated by:   Pierre Bali Normand Sloop, M.D. Proc. Date: 08/12/01 Admit Date:  08/12/2001 Discharge Date: 08/13/2001                             Operative Report  PREOPERATIVE DIAGNOSES:       Intrauterine pregnancy at 14+ weeks with incompetent cervix.  POSTOPERATIVE DIAGNOSES:      Intrauterine pregnancy at 14+ weeks with incompetent cervix with some spotting as told by patient earlier today.  PROCEDURE:                    McDonald cerclage.  SURGEON:                      Naima A. Normand Sloop, M.D.  ANESTHESIA:                   Spinal.  ESTIMATED BLOOD LOSS:         50 cc.  INTRAVENOUS FLUIDS:           1500 cc Crystalloid.  URINE OUTPUT:                 Clear at the end of procedure.  COMPLICATIONS:                None.  FINDINGS:                     A 14 week sized uterus with fetal heart tones seen before the procedure on ultrasound.  She had scant blood in the vault seen even before the procedure was commenced and her cervix was found to be closed, 40%, and high.  No adnexal masses palpated.  PROCEDURE IN DETAIL:          Patient was taken to the operating room where she was given spinal anesthesia, placed in the dorsal lithotomy position. Foley catheter was placed into the bladder and weighted.  Retractor was placed in the posterior portion of the vagina and two skinny Deavers were also used for retraction.  There was a scant amount of blood seen in the vault.  Cervix was noted to be closed and 40% effaced.  The bleeding was thought to maybe come from some effacement.  The anterior and posterior lips of the cervix were grasped with ring forceps.  A McDonald cerclage was done using Mersilene without difficulty.  Hemostasis was noted at end of procedure and there  was noted to be no leakage of fluid.  Urine was clear and rectal examination was within normal limits.  All instruments were then removed from the vagina. Patient went to recovery room in stable condition.  The patient reported spotting earlier today before we took her to the operating room and then an ultrasound which showed a viable fetus with movement and good cardiac activity.  Due to the earlier spotting and her history, we will watch her for 23 hour observation. Dictated by:   Pierre Bali. Normand Sloop, M.D. Attending Physician:  Michael Litter DD:  08/12/01 TD:  08/15/01 Job: [id number]

## 2010-08-30 NOTE — Discharge Summary (Signed)
NAME:  [name], [name]                      ACCOUNT NO.:  [id number]   MEDICAL RECORD NO.:  [id number]                   PATIENT TYPE:  INP   LOCATION:  9131                                 FACILITY:  [facility]   PHYSICIAN:  [name], M.D.              DATE OF BIRTH:  Nov 18, 1965   DATE OF ADMISSION:  12/28/2001  DATE OF DISCHARGE:  12/31/2001                                 DISCHARGE SUMMARY   ADMITTING DIAGNOSES:  1. Preterm intrauterine pregnancy at 34 weeks.  2. Breech presentation.  3. Premature rupture of membranes.  4. Cervical cerclage in place secondary to history of incompetent cervix.   DISCHARGE DIAGNOSES:  1. Preterm intrauterine pregnancy at 34 weeks.  2. Breech presentation.  3. Premature rupture of membranes.  4. Cercivical cerclage in place secondary to history of incompetent cervix.   PROCEDURE:  1. Primary low transverse cesarean section.  2. Cerclage removal.   HOSPITAL COURSE:  The patient is a 45 year old gravida 4, para 0-0-3-0 who  was admitted on December 28, 2001 at 42 and 1/7 weeks with spontaneous  rupture of membranes.  She was having some contractions every three to four  minutes, but was not uncomfortable.  The patient's history was remarkable  for cerclage in place secondary to three previous second trimester losses,  advanced maternal age with amniocentesis declined, positive group B Strep,  history of fibroids, obesity, first trimester spotting, history of abnormal  Pap, questionable LGA on last ultrasound.  She was noted to be leaking clear  fluid on admission.  Cervical examination was deferred.  Cervix appeared to  be closed by visual examination.  Fetus was noted to be breech by bed side  ultrasound.  Fetal heart rate was reassuring.  Dr. Stefano Bruce was consulted  and the decision was made to proceed with cesarean section secondary to  breech presentation, cerclage in place, and premature rupture of the  membranes.  The patient was  taken to the operating room by Dr. Stefano Bruce  where the cervical cerclage was removed without difficulty.  He was assisted  by Dr. Osborn Bruce.  The patient had spinal anesthesia.  Findings were a  viable female by the name of [name].  Apgars 9 and 9.  Weight 7.6 ounces from  a breech presentation.  There were no complications.  Estimated blood loss  was 800 cc.  The patient was taken to the recovery room in good condition.  Infant was taken to the full-term nursery in good condition.  By  postoperative day one patient was doing well.  She was pumping her breast  milk.  She planned IUD for contraception.  Her physical examination was  within normal limits.  Her incision was clean, dry, and intact, although  there was significant panniculus noted.  Hemoglobin on postoperative day one  was 9.6 down from 11.1 pre delivery.  WBC count was 7.7 and platelets were  191,000.  The  rest of the patient's hospital course was uncomplicated.  The  patient's baby remained in regular nursery.  By postoperative day three  patient was doing well.  She was pumping for colostrum.  Her physical  examination was within normal limits.  Her incision was clean, dry, and  intact.  Her fundus was firm.  Dr. Normand Bruce was consulted and decision was  made to maintain the patient's staples until Monday at which time they would  be taken out in the office.  The patient was then deemed to have received  full benefit of her hospital stay and was discharged home.   DISCHARGE INSTRUCTIONS:  Per [facility] handout.   DISCHARGE MEDICATIONS:  1. Motrin 600 mg p.o. q.6h. p.r.n. pain.  2. Tylox one to two p.o. q.3-4h. p.r.n. pain.   DISCHARGE FOLLOWUP:  Will occur in approximately five weeks at [facility] with the patient's plan for IUD to be placed at approximately  six weeks after pelvic cultures were returned.     Renaldo Reel [name], C.N.M.                   Caroline A. [name], M.D.    Leeanne Mannan  D:   12/31/2001  T:  01/03/2002  Job:  [id number]

## 2010-08-30 NOTE — Discharge Summary (Signed)
[name], [name]            ACCOUNT NO.:  [id number]   MEDICAL RECORD NO.:  [id number]          PATIENT TYPE:  INP   LOCATION:  1401                         FACILITY[facility]  [facility]   PHYSICIAN:  Ricki Rodriguez, M.D.  DATE OF BIRTH:  21-Jan-1966   DATE OF ADMISSION:  05/26/2006  DATE OF DISCHARGE:  05/27/2006                               DISCHARGE SUMMARY   DISCHARGE SUMMARY:  Patient admitted by Dr. Ricki Rodriguez, M.D.   PRINCIPAL DIAGNOSES:  1. Noncardiac chest pain.  2. Reflux esophagitis.   DISCHARGE MEDICATIONS:  1. Prilosec 20 mg 1-2 daily.  2. Nortrel 7/7/7 one daily as directed.   DISCHARGE DIET:  Low-fat, low-salt diet.   DISCHARGE ACTIVITIES:  Patient to increase activities slowly.   FOLLOWUP:  By Dr. Orpah Cobb in two weeks.  Patient to call [phone]  for appointment.   HISTORY:  This is a 45 year old black female complaining of retrosternal  chest pain off and on since one day, lasting for 3-4 hours.   PHYSICAL EXAMINATION:  VITAL SIGNS:  Temperature 97.1, pulse 56,  respirations 18, blood pressure 155/87, height 5 feet 8 inches, weight  257 pounds.  GENERAL:  Patient is well developed, well-nourished black female in no  acute distress.  HEENT:  Patient is normocephalic, atraumatic, has brown eyes.  Conjunctivae pink.  Sclerae nonicteric.  NECK:  No JVD.  No carotid bruits.  LUNGS:  Clear bilaterally.  CHEST WALL:  Nontender.  HEART:  Normal S1 and S2.  ABDOMEN:  Soft, distended but nontender.  EXTREMITIES:  Trace edema.  SKIN:  Warm and dry.  CNS:  Patient moves all four extremities.   LABORATORY DATA:  Normal WBC count, platelet count, slightly low  hemoglobin at 11.8, hematocrit 34.6.  INR of 1.0.  Electrolytes normal.  BUN 9, creatinine 0.68.  CPK total elevated, but MB normal and troponin  I normal x3.  Cholesterol level 164 with a triglyceride of 154, HDL  cholesterol of 36 and LDL cholesterol of 97.   EKG: Normal sinus rhythm with nonspecific  T-wave changes.   Chest x-ray revealed borderline cardiomegaly with no active pulmonary  disease.   CT of the chest without evidence of pulmonary embolism.   Nuclear stress test:  Normal myocardial perfusion with ejection fraction  of 56%.   HOSPITAL COURSE:  Patient was admitted to telemetry unit.  Myocardial  infarction was ruled out.  She underwent a nuclear stress test that  failed to show reversible ischemia.  Hence patient was treated for  noncardiac chest pain and discharged home in satisfactory condition with  followup by me in two weeks.      Ricki Rodriguez, M.D.  Electronically Signed     ASK/MEDQ  D:  09/24/2006  T:  09/24/2006  Job:  [id number]

## 2010-08-30 NOTE — Discharge Summary (Signed)
[facility]  Patient:    [name], [name] Visit Number: [id number] MRN: [id number]          Service Type: DSU Location: 9300 [id number] Attending Physician:  Jaymes Graff A Dictated by:   Nigel Bridgeman, C.N.M. Admit Date:  08/12/2001 Discharge Date: 08/13/2001                             Discharge Summary  ADMITTING DIAGNOSES:          1. Intrauterine pregnancy at 14-1/2 weeks.                               2. Incompetent cervix.  POSTOPERATIVE DIAGNOSES:      1. Intrauterine pregnancy at 14-1/2 weeks.                               2. Incompetent cervix.                               3. Spotting.  PROCEDURES:                   1. McDonald cerclage.                               2. Spinal anesthesia.  HOSPITAL COURSE:              The patient is a 45 year old gravida 4, para 0-0-3-0 who presented at 14-3/7 weeks for cervical cerclage secondary to her history of three second trimester losses with a history of premature rupture of membranes and chorioamnionitis.  The patient was taken to the operating room, where a McDonald cervical cerclage was placed by Dr. Normand Sloop under spinal anesthesia.  The patient had essentially an uncomplicated procedure. There was a slight amount of blood in the vault before the procedure.  The cervix was closed and 40% with the presenting part very high.  The morning after the procedure, the patient was doing well.  She was on Motrin.  Fetal heart rate was in the 140s.  She was deemed to receive full benefit of her hospital stay and was discharged home by Dr. Normand Sloop.  DISCHARGE INSTRUCTIONS:       The patient to be on level 2 bedrest and pelvic rest.  She is to call for any bleeding, leaking, or significant abdominal pain.  DISCHARGE MEDICATIONS:        1. Motrin 600 mg p.o. q.6h. on a p.r.n. basis.                               2. The patient is also on Indocin for 24 hours.  FOLLOWUP:                      Discharge followup will occur in one week at the office for a followup appointment. Dictated by:   Nigel Bridgeman, C.N.M. Attending Physician:  Michael Litter DD:  08/13/01 TD:  08/16/01 Job: [id number]

## 2010-08-30 NOTE — H&P (Signed)
[facility]  Patient:    [name], [name] Visit Number: [id number] MRN: [id number]          Service Type: OBS Location: [facility] Attending Physician:  Jaymes Graff A Dictated by:   Pierre Bali Normand Sloop, M.D. Admit Date:  07/23/2001 Discharge Date: 07/23/2001                           History and Physical  HISTORY OF PRESENT ILLNESS:   The patient is a 45 year old G4, P0-0-3-0, whose last menstrual period was May 03, 2001, with estimated due date of February 07, 2002, consistent with 11+ week ultrasound, and presenting at 14-3/7 weeks today for cervical cerclage due to incompetent cervix.  The patient has a history of three second trimester losses where she had preterm rupture of membranes and preterm labor with chorioamnionitis.  PAST OBSTETRIC HISTORY:       In August 1983 she had a preterm labor female at six months.  In November 2001 she had preterm labor, rupture of membranes, female at four months.  On January 30, 2001, had a female preterm, rupture of membranes and preterm labor, at 18 weeks.  None of the infants lived.  PAST GYNECOLOGIC HISTORY:     Significant for an abnormal Pap with low-grade SIL.  She had a colposcopy done in December 2002, which was consistent with low-grade SIL.  PAST MEDICAL HISTORY:         Unremarkable.  PAST SURGICAL HISTORY:        Significant for a D&C with her first pregnancy.  ALLERGIES:                    No known drug allergies.  SOCIAL HISTORY:               Negative x3.  PHYSICAL EXAMINATION:  VITAL SIGNS:                  The patient is 253 pounds.  Her blood pressure is 110/70.  CHEST:                        Lungs are clear.  CARDIAC:                      Heart is regular.  ABDOMEN:                      Obese, soft, and nontender.  PELVIC:                       Cervix was long and closed per CNM.  The uterus was difficult to tell the size secondary to obesity.  IMPRESSION:                    The patient is 14 weeks with incompetent cervix, desires cervical cerclage.  Understands the risks are but not limited to bleeding, infection, damage to internal organs such as bladder.  Patients GC, Chlamydia cultures were negative in October 2, and she is B positive, antibody negative. Dictated by:   Pierre Bali. Normand Sloop, M.D. Attending Physician:  Michael Litter DD:  08/12/01 TD:  08/12/01 Job: [id number]

## 2010-08-30 NOTE — H&P (Signed)
NAME:  [name], [name]                      ACCOUNT NO.:  [id number]   MEDICAL RECORD NO.:  [id number]                   PATIENT TYPE:  INP   LOCATION:  9198                                 FACILITY:  [facility]   PHYSICIAN:  Janine Limbo, M.D.            DATE OF BIRTH:  05-07-1965   DATE OF ADMISSION:  12/28/2001  DATE OF DISCHARGE:                                HISTORY & PHYSICAL   HISTORY OF PRESENT ILLNESS:  The patient is a 45 year old gravida 4, para 0-  0-3-0, at 34-1/7 weeks, who presents today with spontaneous rupture of  membranes at approximately 5:30 this morning.  She notes clear fluid.  She  is aware of some mild contractions, but she is not uncomfortable with them.   The patient's history is remarkable for:  1. Cerclage in place secondary to 3 previous second trimester losses.  2. Advanced maternal age with amnio declined.  3. Positive group B strep.  4. History of fibroids.  5. Obesity.  6. First trimester spotting.  7. History of abnormal Pap.  8. Questionable LGA on last ultrasound.   PRENATAL LABS:  Blood type is B positive.  Rh antibody negative.  VDRL  nonreactive.  Rubella titer positive.  Hepatitis B surface antigen negative.  HIV nonreactive.  Sickle cell test was negative.  GC cultures were negative.  Pap was normal.  Glucose challenge was normal.  Patient declined AFP.  EDC  of 02/07/02 was established by last menstrual period and was in agreement  with ultrasound at approximately 11-6/7 weeks.  Hemoglobin upon entry into  practice was 11.3, it was 10.8 at 26 weeks.   HISTORY OF PRESENT PREGNANCY:  The patient appeared at approximately 13  weeks wit her history of 3 previous premature rupture of membranes and  preterm labor losses.  Plan was made for cervical cerclage on May 1.  This  was placed by Dr. Normand Sloop.  She was placed on modified bed rest from that  time.  She had an ultrasound done at 20 weeks that showed normal fluid and  growth.   Cervix was 3.2 cm on ultrasound at 20 weeks after the cerclage.  She had another ultrasound on July 9 at 24 weeks, showing a cervix of 2 cm.  Cervix was rechecked at 25 weeks with cervix 2.5 cm on ultrasound.  She  began to measure size greater than dates early in her pregnancy by at least  20 weeks.  She had ultrasound at 29 weeks that showed a breech presentation,  normal fluid, and estimated fetal weight greater than the 95th percentile.  She was placed on iron.  Fasting blood sugar was checked and was within  normal limits.  The rest of her pregnancy was essentially uncomplicated.  She was seen in the office yesterday on 12/27/01 with cervix closed and  stitch intact.   OBSTETRICAL HISTORY:  In 1983, she had a vaginal birth of a female  infant at 6  months gestation secondary to preterm labor; that infant did pass away.  In  November of 2001, she had a vaginal delivery of a 55-month gestation female  with premature ruptured membranes and preterm labor, probable  chorioamnionitis.  In October of 2002, she had a vaginal birth of an 102 to  11 week female fetus after premature ruptured membranes and preterm labor with  probable chorio.  She had to have a D&C after the first pregnancy for  retained placenta.   MEDICAL HISTORY:  She had a history of an abnormal Pap and low-grade SIL in  the past.  She reports the usual childhood illnesses.  Her only other  surgery was the [facility] following her first delivery in 60.   ALLERGIES:  The patient has no known medication allergies.   FAMILY HISTORY:  Her mother has hypertension.  Her sister and brother have  diabetes of some type.   GENETIC HISTORY:  Remarkable for the patient being 36 at the time of  delivery with amnio and AFP declined.   SOCIAL HISTORY:  The patient is married, father of the baby is involved and  supportive.  He is not currently with her since he was at work; he had gone  to work before her water broke.  We had not been able to  contact him at the  time when she went to surgery.  The patient is unemployed.  She denies any  alcohol, drug, or tobacco use during this pregnancy.  She has been followed  by the Physician Service at [facility].   PHYSICAL EXAMINATION:  VITAL SIGNS:  Stable, the patient is afebrile.  HEENT:  Within normal limits.  LUNGS:  Clear.  HEART:  Regular rate and rhythm without murmur.  BREASTS:  Soft and nontender.  ABDOMEN:  Fundal height is approximately 40 cm.  Estimated fetal weight is 7  to 7-1/2 pounds by last ultrasound.  Uterine contractions are every 2 to 6  minutes, mild quality.  Speculum exam reveals positive frond, positive  pooling, positive nitrazine.  Fetus is also breech by bedside ultrasound.  Fetal heart rate is reassuring with slightly decreased variability, but no  decelerations were noted.  EXTREMITIES:  Deep tendon reflexes are 2+ without clonus.  There is a trace  edema noted.   IMPRESSION:  1. Intrauterine pregnancy at 34-1/7 weeks.  2. Premature rupture of the membranes.  3. Patient with cerclage in place secondary to incompetent cervix.  4. Breech presentation.  5. Uterine contractions.  6. Positive group B strep.   PLAN:  1. Admit to the [facility] for consult with Dr. Marline Backbone as attending physician.  2. Plan to proceed with primary low transverse Cesarean section secondary to     breech presentation, spontaneous rupture of membranes and uterine     contractions with a history of a cerclage.  3. Risks and benefits of surgery were reviewed with the patient by Dr.     Stefano Gaul, including bleeding, damage to other organs and infection, as     well as failure of anesthesia method.  4. Dr. Stefano Gaul will plan to take the cerclage out in the OR prior to     beginning the surgery.     [name], C.N.M.                   Janine Limbo, M.D.    VLL/MEDQ  D:  12/28/2001  T:  12/28/2001  Job:  [id number]

## 2010-08-30 NOTE — Op Note (Signed)
NAMEJERE, [name]                      ACCOUNT NO.:  [id number]   MEDICAL RECORD NO.:  [id number]                   PATIENT TYPE:  INP   LOCATION:  9198                                 FACILITY:  [facility]   PHYSICIAN:  Osborn Coho, M.D.                DATE OF BIRTH:  1966-02-05   DATE OF PROCEDURE:  12/28/2001  DATE OF DISCHARGE:                                 OPERATIVE REPORT   PREOPERATIVE DIAGNOSES:  1. Preterm intrauterine pregnancy.  2. Breech presentation.  3. Rupture of membranes.   POSTOPERATIVE DIAGNOSES:  1. Preterm intrauterine pregnancy.  2. Breech presentation.  3. Rupture of membranes.   PROCEDURE:  Primary low transverse cesarean section via Pfannenstiel skin  incision.   SURGEON:  Osborn Coho, M.D.   ASSISTANT:  Janine Limbo, M.D.   ANESTHESIA:  Spinal.   FLUIDS:  3200 cc.   ESTIMATED BLOOD LOSS:  800 cc.   URINE OUTPUT:  200 cc.   COMPLICATIONS:  None.   FINDINGS:  Live female infant named [name] with Apgars of 9 at one minute and  9 at five minutes delivered via breech presentation.   PROCEDURE:  The patient was taken to the operating room after risks,  benefits, and alternatives discussed with patient and consent signed and  witnessed.  The patient was given a spinal and placed in the dorsal supine  position with a leftward tilt.  The patient was prepped and cerclage stitch  removed from the anterior lip of the cervix after Foley was placed under  sterile conditions.  The patient was draped in the normal sterile fashion.  A Pfannenstiel skin incision was made and carried down to the underlying  layer of fascia with the Bovie.  The fascia was excised with the Bovie and  extended laterally with the Mayo scissors.  Kocher clamps were placed on the  inferior aspect of the fascial incision and the rectus muscle excised from  the fascia.  The same was done on the superior aspect of the fascial  incision.  The muscle was separated in  the midline manually and the  peritoneum entered bluntly.  A bladder flap was created with the Metzenbaum  scissors.  Bladder blade was used to protect the bladder and uterine  incision made and extended laterally with the bandage scissors.  The infant  was found to be in the breech presentation and delivered via breech  extraction without difficulty.  The infant was bulb suctioned and the cord  clamped and cut.  The infant was handed off to the waiting pediatricians.  Cord blood was sent.  The uterine incision was repaired using 0 Vicryl on  the first layer and a running locked stitch was performed.  An embrocating  layer was performed using 2-0 Vicryl.  The ovaries and tubes bilaterally  were identified and appeared normal.  Irrigation was performed.  The  peritoneal incision was reapproximated using 2-0  Vicryl.  The fascia was  repaired with 0 Vicryl in a running fashion.  The skin was closed with  staples.  The patient tolerated the procedure well and was returned to the  recovery room in stable condition.                                               Osborn Coho, M.D.    AR/MEDQ  D:  12/28/2001  T:  12/28/2001  Job:  [id number]

## 2010-10-28 ENCOUNTER — Other Ambulatory Visit (HOSPITAL_COMMUNITY): Payer: Self-pay | Admitting: Internal Medicine

## 2010-12-03 ENCOUNTER — Other Ambulatory Visit (HOSPITAL_COMMUNITY): Payer: Self-pay | Admitting: Internal Medicine

## 2010-12-03 DIAGNOSIS — Z1231 Encounter for screening mammogram for malignant neoplasm of breast: Secondary | ICD-10-CM

## 2010-12-05 ENCOUNTER — Ambulatory Visit (HOSPITAL_COMMUNITY)
Admission: RE | Admit: 2010-12-05 | Discharge: 2010-12-05 | Disposition: A | Discharge status: Home / Self Care | Payer: Self-pay | Source: Ambulatory Visit | Attending: Internal Medicine | Admitting: Internal Medicine

## 2010-12-05 DIAGNOSIS — Z1231 Encounter for screening mammogram for malignant neoplasm of breast: Secondary | ICD-10-CM

## 2011-01-07 LAB — RPR: RPR Ser Ql: NONREACTIVE

## 2011-01-07 LAB — CBC
HCT: 30.3 — ABNORMAL LOW
Hemoglobin: 12
MCHC: 34.4
MCV: 79.9
Platelets: 202
RBC: 3.79 — ABNORMAL LOW
RBC: 4.3
WBC: 9.8

## 2011-01-09 LAB — CBC
HCT: 39.3
Hemoglobin: 13
RBC: 4.88
WBC: 4.5

## 2011-01-09 LAB — DIFFERENTIAL
Basophils Absolute: 0
Lymphocytes Relative: 34
Lymphs Abs: 1.5
Monocytes Absolute: 0.3
Monocytes Relative: 6
Neutro Abs: 2.6

## 2011-01-09 LAB — URINALYSIS, ROUTINE W REFLEX MICROSCOPIC
Nitrite: NEGATIVE
Specific Gravity, Urine: 1.02
Urobilinogen, UA: 0.2
pH: 6

## 2011-01-09 LAB — BASIC METABOLIC PANEL
Calcium: 9.2
GFR calc Af Amer: >60
GFR calc non Af Amer: >60
Potassium: 3.9
Sodium: 139

## 2011-01-09 LAB — PREGNANCY, URINE: Preg Test, Ur: NEGATIVE

## 2011-05-31 ENCOUNTER — Ambulatory Visit (HOSPITAL_COMMUNITY)
Admission: RE | Admit: 2011-05-31 | Discharge: 2011-05-31 | Disposition: A | Discharge status: Home / Self Care | Source: Ambulatory Visit | Attending: Family Medicine | Admitting: Family Medicine

## 2011-05-31 DIAGNOSIS — M79609 Pain in unspecified limb: Secondary | ICD-10-CM | POA: Insufficient documentation

## 2011-05-31 DIAGNOSIS — M79669 Pain in unspecified lower leg: Secondary | ICD-10-CM

## 2011-06-02 NOTE — Progress Notes (Signed)
VASCULAR LAB PRELIMINARY  PRELIMINARY  PRELIMINARY  PRELIMINARY  Right lower extremity venous duplex completed 05/31/11   Preliminary report:  Right:  No evidence of DVT, superficial thrombosis, or Baker's cyst.  Aleksey Newbern D, RVS 06/02/2011, 12:35 PM

## 2011-08-16 ENCOUNTER — Ambulatory Visit: Payer: Self-pay | Admitting: Emergency Medicine

## 2011-08-16 VITALS — BP 139/90 | HR 71 | Temp 98.4°F | Resp 18 | Ht 64.0 in | Wt 243.8 lb

## 2011-08-16 DIAGNOSIS — I10 Essential (primary) hypertension: Secondary | ICD-10-CM

## 2011-08-16 LAB — POCT CBC
Granulocyte percent: 69.6 %G (ref 37–80)
MCV: 77.4 fL — AB (ref 80–97)
MID (cbc): 0.3 (ref 0–0.9)
MPV: 9 fL (ref 0–99.8)
POC Granulocyte: 3.8 (ref 2–6.9)
POC LYMPH PERCENT: 24.8 %L (ref 10–50)
POC MID %: 5.6 %M (ref 0–12)
Platelet Count, POC: 274 10*3/uL (ref 142–424)
RDW, POC: 18.5 %

## 2011-08-16 LAB — COMPREHENSIVE METABOLIC PANEL
AST: 14 U/L (ref 0–37)
Albumin: 3.9 g/dL (ref 3.5–5.2)
Alkaline Phosphatase: 69 U/L (ref 39–117)
BUN: 17 mg/dL (ref 6–23)
Potassium: 4.1 mEq/L (ref 3.5–5.3)

## 2011-08-16 MED ORDER — HYDROCHLOROTHIAZIDE 25 MG PO TABS: 25.0000 mg | ORAL_TABLET | Freq: Every day | ORAL | Status: DC

## 2011-08-16 MED ORDER — POTASSIUM CHLORIDE CRYS ER 20 MEQ PO TBCR: 20.0000 meq | EXTENDED_RELEASE_TABLET | Freq: Every day | ORAL | Status: DC

## 2011-08-16 NOTE — Patient Instructions (Signed)

## 2011-08-16 NOTE — Progress Notes (Signed)
  Subjective:    Patient ID: [name], female    DOB: 09-03-1965, 46 y.o.   MRN: [id number]  HPI patient and her sister this week from terminal lung cancer. She purchased a wrist cuff blood pressure monitor and her blood pressures have been up significantly. She has no chest pain no shortness of breath no other symptoms. She seems to be recovering from the loss of her sister. She is involved in her church. She has been on HCTZ in the past for blood pressure control.    Review of Systems alert cooperative female in no acute distress.    Objective:   Physical Exam HEENT exam is within normal limits neck supple. Chest is clear. Repeat blood pressure is 154/94. Left arm seated        Assessment & Plan:  Patient under a lot of stress at home. She is African American and significantly overweight. We'll check baseline labs start on HCTZ which she has taken before.

## 2011-09-03 ENCOUNTER — Telehealth: Payer: Self-pay

## 2011-09-03 NOTE — Telephone Encounter (Signed)
Pt in office and was given rx for stress and she would like to know if dr can call in another rx for something different but for same diagnosis.

## 2011-09-04 NOTE — Telephone Encounter (Signed)
Please clarify.  The patient was given medication for elevated BP (HCTZ and potassium).

## 2011-09-05 NOTE — Telephone Encounter (Signed)
CALLED PT. CONFUSED ABOUT WHAT SHE WAS INQUIRING ABOUT BUT SHE SAID SHE DOESN'T NEED ANYTHING FROM [place] AND TO THANK [place] FOR CALLING HER BACK.

## 2011-10-09 ENCOUNTER — Ambulatory Visit: Payer: Self-pay

## 2011-10-10 ENCOUNTER — Ambulatory Visit: Payer: Self-pay | Admitting: Emergency Medicine

## 2011-10-10 VITALS — BP 117/80 | HR 80 | Temp 98.7°F | Resp 18 | Ht 64.25 in | Wt 253.6 lb

## 2011-10-10 DIAGNOSIS — Z01419 Encounter for gynecological examination (general) (routine) without abnormal findings: Secondary | ICD-10-CM

## 2011-10-10 DIAGNOSIS — I10 Essential (primary) hypertension: Secondary | ICD-10-CM

## 2011-10-10 MED ORDER — FLUCONAZOLE 150 MG PO TABS: 150.0000 mg | ORAL_TABLET | Freq: Once | ORAL | Status: AC

## 2011-10-10 NOTE — Progress Notes (Signed)
  Subjective:    Patient ID: [name], female    DOB: 1966/04/04, 46 y.o.   MRN: [id number]  Gynecologic Exam The patient's pertinent negatives include no genital itching, genital lesions, genital odor, genital rash, missed menses, pelvic pain, vaginal bleeding or vaginal discharge. The patient is experiencing no pain. She is not pregnant. Associated symptoms include diarrhea. Pertinent negatives include no anorexia, back pain, chills, discolored urine, dysuria, fever, flank pain, frequency, headaches, hematuria, joint pain, joint swelling, nausea, painful intercourse, rash, sore throat or urgency. Nothing aggravates the symptoms. She has tried nothing for the symptoms. She is sexually active. She uses tubal ligation for contraception. Her past medical history is significant for miscarriage. There is no history of an abdominal surgery, a Cesarean section, an ectopic pregnancy, endometriosis, a gynecological surgery, herpes simplex, menorrhagia, metrorrhagia, ovarian cysts, perineal abscess, PID, an STD, a terminated pregnancy or vaginosis.  Diarrhea  The current episode started yesterday. The problem occurs 5 to 10 times per day. The problem has been rapidly improving. The stool consistency is described as watery. The patient states that diarrhea does not awaken her from sleep. Pertinent negatives include no arthralgias, bloating, chills, coughing, fever, headaches, increased  flatus, myalgias, sweats, URI or weight loss. The symptoms are aggravated by diary products. Risk factors include no known risk factors. She has tried nothing for the symptoms. There is no history of bowel resection, inflammatory bowel disease, irritable bowel syndrome, malabsorption, a recent abdominal surgery or short gut syndrome.      Review of Systems  Constitutional: Negative.  Negative for fever, chills and weight loss.  HENT: Negative.  Negative for sore throat.   Eyes: Negative.   Respiratory: Negative.   Negative for cough.   Cardiovascular: Negative.   Gastrointestinal: Positive for diarrhea. Negative for nausea, blood in stool, rectal pain, bloating, anorexia and flatus.  Genitourinary: Negative for dysuria, urgency, frequency, hematuria, flank pain, vaginal discharge, pelvic pain, menorrhagia and missed menses.  Musculoskeletal: Negative for myalgias, back pain, joint pain and arthralgias.  Skin: Negative for rash.  Neurological: Negative for headaches.       Objective:   Physical Exam  Constitutional: She is oriented to person, place, and time. She appears well-developed and well-nourished.  HENT:  Head: Normocephalic and atraumatic.  Right Ear: External ear normal.  Left Ear: External ear normal.  Eyes: Conjunctivae are normal. Pupils are equal, round, and reactive to light.  Neck: Normal range of motion. Neck supple.  Cardiovascular: Normal rate and regular rhythm.   Pulmonary/Chest: Effort normal and breath sounds normal.  Abdominal: Soft. There is no tenderness.  Genitourinary: Uterus normal. Vaginal discharge found.  Musculoskeletal: Normal range of motion.  Neurological: She is alert and oriented to person, place, and time.  Skin: Skin is warm and dry.          Assessment & Plan:  Thick white discharge no odor  Requests change in her antihypertensive  Follow up with FMD in a month or so off her BP med as she is 116/78 today

## 2011-10-14 LAB — PAP IG, CT-NG, RFX HPV ASCU
Chlamydia Probe Amp: NEGATIVE
GC Probe Amp: NEGATIVE

## 2011-10-15 ENCOUNTER — Encounter: Payer: Self-pay | Admitting: Emergency Medicine

## 2011-11-06 ENCOUNTER — Ambulatory Visit: Payer: Self-pay | Admitting: Family Medicine

## 2011-12-12 ENCOUNTER — Ambulatory Visit (INDEPENDENT_AMBULATORY_CARE_PROVIDER_SITE_OTHER): Admitting: Obstetrics and Gynecology

## 2011-12-12 ENCOUNTER — Encounter: Payer: Self-pay | Admitting: Obstetrics and Gynecology

## 2011-12-12 VITALS — BP 141/90 | HR 71 | Temp 97.3°F | Ht 65.0 in | Wt 257.3 lb

## 2011-12-12 DIAGNOSIS — N924 Excessive bleeding in the premenopausal period: Secondary | ICD-10-CM

## 2011-12-12 NOTE — Progress Notes (Addendum)
  [facility]  Patient name: [name] MRN [id number]  Date of birth: 01-Aug-1965  CC & HPI:  [name] is a 46 y.o. female presenting today for evaluation of 1 episode of heavy menstrual bleeding.  She also reports a small bump in her R axilla.    1 episode occurred after a missed period.  Her period earlier this month was 4 days long and on the 2nd day she was sitting down and noted some bleeding.  Stood up and described a gush of blood like her water breaking.  Blood down to floor.  Did not reoccur. Pt is s/p BTL 4 years ago.. Reports negative Urine Pregnancy Test 3 days after her period ended.  Small bump was noticed in axilla associated with shaving.  Has been present for 2-3 months.  Up todate on her mammograms and PAP smears  ROS:  Per HPI  Pertinent History Reviewed:  Medical & Surgical Hx:  Reviewed: Significant for BTL Medications: Reviewed & Updated - see associated section Social History: Reviewed - Significant for recently lost sister  Objective Findings:  Vitals:  Filed Vitals:   12/12/11 0926  BP: 141/90  Pulse: 71  Temp: 97.3 F (36.3 C)    PE: GENERAL:  Adult obese AA female. In no discomfort; no respiratory distress. PSYCH: Alert and appropriately interactive; Insight:Good   Pelvic exam: normal external genitalia, vulva, vagina, cervix, uterus and adnexa Skin: Axilla with small 2mm subcutaneous nodule.  Non-adhered, no erythema, no fluctuance.    Assessment & Plan:  Likely perimenopausal state - pt to return if heavy bleeding reoccurs. Axillary cutaneous scar tissue - continue to monitor.  Not a lymph node.  Continue mammograms.   Reassurance provided for both   I saw pt and agree with above resident MD documentation Danae Orleans, CNM 12/12/2011 10:59 AM

## 2011-12-12 NOTE — Patient Instructions (Signed)
It was nice to meet you  I believe you are having some symptoms of peri-menopause.   If you continue having heavy periods please return for further evaluation.  Perimenopause Perimenopause is the time when your body begins to move into the menopause (no menstrual period for 12 straight months). It is a natural process. Perimenopause can begin 2 to 8 years before the menopause and usually lasts for one year after the menopause. During this time, your ovaries may or may not produce an egg. The ovaries vary in their production of estrogen and progesterone hormones each month. This can cause irregular menstrual periods, difficulty in getting pregnant, vaginal bleeding between periods and uncomfortable symptoms. CAUSES  Irregular production of the ovarian hormones, estrogen and progesterone, and not ovulating every month.   Other causes include:   Tumor of the pituitary gland in the brain.   Medical disease that affects the ovaries.   Radiation treatment.   Chemotherapy.   Unknown causes.   Heavy smoking and excessive alcohol intake can bring on perimenopause sooner.  SYMPTOMS   Hot flashes.   Night sweats.   Irregular menstrual periods.   Decrease sex drive.   Vaginal dryness.   Headaches.   Mood swings.   Depression.   Memory problems.   Irritability.   Tiredness.   Weight gain.   Trouble getting pregnant.   The beginning of losing bone cells (osteoporosis).   The beginning of hardening of the arteries (atherosclerosis).  DIAGNOSIS  Your caregiver will make a diagnosis by analyzing your age, menstrual history and your symptoms. They will do a physical exam noting any changes in your body, especially your female organs. Female hormone tests may or may not be helpful depending on the amount and when you produce the female hormones. However, other hormone tests may be helpful (ex. thyroid hormone) to rule out other problems. TREATMENT  The decision to treat during  the perimenopause should be made by you and your caregiver depending on how the symptoms are affecting you and your life style. There are various treatments available such as:  Treating individual symptoms with a specific medication for that symptom (ex. tranquilizer for depression).   Herbal medications that can help specific symptoms.   Counseling.   Group therapy.   No treatment.  HOME CARE INSTRUCTIONS   Before seeing your caregiver, make a list of your menstrual periods (when the occur, how heavy they are, how long between periods and how long they last), your symptoms and when they started.   Take the medication as recommended by your caregiver.   Sleep and rest.   Exercise.   Eat a diet that contains calcium (good for your bones) and soy (acts like estrogen hormone).   Do not smoke.   Avoid alcoholic beverages.   Taking vitamin E may help in certain cases.   Take calcium and vitamin D supplements to help prevent bone loss.   Group therapy is sometimes helpful.   Acupuncture may help in some cases.  SEEK MEDICAL CARE IF:   You have any of the above and want to know if it is perimenopause.   You want advice and treatment for any of your symptoms mentioned above.   You need a referral to a specialist (gynecologist, psychiatrist or psychologist).  SEEK IMMEDIATE MEDICAL CARE IF:   You have vaginal bleeding.   Your period lasts longer than 8 days.   You periods are recurring sooner than 21 days.   You have bleeding  after intercourse.   You have severe depression.   You have pain when you urinate.   You have severe headaches.   You develop vision problems.  Document Released: 05/08/2004 Document Revised: 03/20/2011 Document Reviewed: 01/27/2008 [facility] Patient Information 2012 [place], [place].

## 2011-12-24 ENCOUNTER — Other Ambulatory Visit: Payer: Self-pay | Admitting: Obstetrics & Gynecology

## 2011-12-24 DIAGNOSIS — Z1231 Encounter for screening mammogram for malignant neoplasm of breast: Secondary | ICD-10-CM

## 2012-01-07 ENCOUNTER — Ambulatory Visit (HOSPITAL_COMMUNITY)
Admission: RE | Admit: 2012-01-07 | Discharge: 2012-01-07 | Disposition: A | Discharge status: Home / Self Care | Source: Ambulatory Visit | Attending: Obstetrics & Gynecology | Admitting: Obstetrics & Gynecology

## 2012-01-07 DIAGNOSIS — Z1231 Encounter for screening mammogram for malignant neoplasm of breast: Secondary | ICD-10-CM

## 2012-01-12 ENCOUNTER — Other Ambulatory Visit: Payer: Self-pay | Admitting: Obstetrics & Gynecology

## 2012-01-12 DIAGNOSIS — R928 Other abnormal and inconclusive findings on diagnostic imaging of breast: Secondary | ICD-10-CM

## 2012-02-02 ENCOUNTER — Encounter (HOSPITAL_COMMUNITY): Payer: Self-pay | Admitting: *Deleted

## 2012-02-10 ENCOUNTER — Other Ambulatory Visit: Payer: Self-pay | Admitting: Obstetrics and Gynecology

## 2012-02-10 ENCOUNTER — Ambulatory Visit (HOSPITAL_COMMUNITY)
Admission: RE | Admit: 2012-02-10 | Discharge: 2012-02-10 | Disposition: A | Discharge status: Home / Self Care | Payer: Self-pay | Source: Ambulatory Visit | Attending: Obstetrics and Gynecology | Admitting: Obstetrics and Gynecology

## 2012-02-10 ENCOUNTER — Encounter (HOSPITAL_COMMUNITY): Payer: Self-pay

## 2012-02-10 VITALS — BP 128/76 | Temp 99.1°F | Ht 65.0 in | Wt 255.6 lb

## 2012-02-10 DIAGNOSIS — R928 Other abnormal and inconclusive findings on diagnostic imaging of breast: Secondary | ICD-10-CM

## 2012-02-10 DIAGNOSIS — Z1239 Encounter for other screening for malignant neoplasm of breast: Secondary | ICD-10-CM

## 2012-02-10 DIAGNOSIS — R2230 Localized swelling, mass and lump, unspecified upper limb: Secondary | ICD-10-CM | POA: Insufficient documentation

## 2012-02-10 HISTORY — DX: Essential (primary) hypertension: I10

## 2012-02-10 NOTE — Progress Notes (Signed)
Complaints of small right axillary lump. Patient referred from the [facility] for additional imaging of the right breast. Screening mammogram was completed 01/07/2012 at the [facility] mammography.  Pap Smear:    Pap smear not performed today. Patients last Pap smear was 05/06/2010 at the [facility] and normal. Per patient she has no history of abnormal Pap smears. Pap smear result above is in EPIC.  Physical exam: Breasts Breasts symmetrical. No skin abnormalities bilateral breasts. No nipple retraction bilateral breasts. No nipple discharge bilateral breasts. No lymphadenopathy. No lumps palpated left breast. Palpated small lump in right axilla. No complaints of pain or tenderness on exam. Patient referred to the [facility] of [facility] for right breast diagnostic mammogram and possible ultrasound. Appointment scheduled for Monday, February 16, 2012 at 0815.         Pelvic/Bimanual No Pap smear completed today since last Pap smear was 05/06/10 and normal. Pap smear not indicated per BCCCP guidelines.

## 2012-02-10 NOTE — Patient Instructions (Signed)
Taught patient how to perform BSE and gave educational materials to take home. Patient did not need a Pap smear today due to last Pap smear was 05/06/2010. Let her know [facility] will cover Pap smears every 3 years unless has a history of abnormal Pap smears. Patient referred to the [facility] of [facility] for right breast diagnostic mammogram and possible ultrasound. Appointment scheduled for Monday, February 16, 2012 at 0815. Patient aware of appointment and will be there. Patient verbalized understanding.

## 2012-02-10 NOTE — Assessment & Plan Note (Signed)
Right axilla lump. Patient referred to the [facility] of [facility] for right breast diagnostic mammogram and possible ultrasound. Appointment scheduled for Monday, February 16, 2012 at 0815.

## 2012-02-16 ENCOUNTER — Ambulatory Visit
Admission: RE | Admit: 2012-02-16 | Discharge: 2012-02-16 | Disposition: A | Discharge status: Home / Self Care | Payer: No Typology Code available for payment source | Source: Ambulatory Visit | Attending: Obstetrics & Gynecology | Admitting: Obstetrics & Gynecology

## 2012-02-16 ENCOUNTER — Ambulatory Visit
Admission: RE | Admit: 2012-02-16 | Discharge: 2012-02-16 | Disposition: A | Discharge status: Home / Self Care | Payer: No Typology Code available for payment source | Source: Ambulatory Visit | Attending: Obstetrics and Gynecology | Admitting: Obstetrics and Gynecology

## 2012-02-16 DIAGNOSIS — R928 Other abnormal and inconclusive findings on diagnostic imaging of breast: Secondary | ICD-10-CM

## 2012-04-15 NOTE — Addendum Note (Signed)
Encounter addended by: Saintclair Halsted, RN on: 04/15/2012  3:47 PM<BR>     Documentation filed: Charges VN

## 2012-11-17 ENCOUNTER — Other Ambulatory Visit: Payer: Self-pay | Admitting: Obstetrics and Gynecology

## 2012-12-20 ENCOUNTER — Other Ambulatory Visit: Payer: Self-pay | Admitting: *Deleted

## 2012-12-20 DIAGNOSIS — N6452 Nipple discharge: Secondary | ICD-10-CM

## 2012-12-21 ENCOUNTER — Ambulatory Visit (HOSPITAL_COMMUNITY): Payer: Self-pay

## 2012-12-31 ENCOUNTER — Other Ambulatory Visit: Payer: Self-pay | Admitting: *Deleted

## 2012-12-31 DIAGNOSIS — N6452 Nipple discharge: Secondary | ICD-10-CM

## 2013-01-04 ENCOUNTER — Ambulatory Visit (HOSPITAL_COMMUNITY)
Admission: RE | Admit: 2013-01-04 | Discharge: 2013-01-04 | Disposition: A | Discharge status: Home / Self Care | Payer: Self-pay | Source: Ambulatory Visit | Attending: Obstetrics and Gynecology | Admitting: Obstetrics and Gynecology

## 2013-01-04 ENCOUNTER — Encounter (HOSPITAL_COMMUNITY): Payer: Self-pay

## 2013-01-04 VITALS — BP 138/90 | Temp 98.1°F | Ht 64.0 in | Wt 259.4 lb

## 2013-01-04 DIAGNOSIS — N6452 Nipple discharge: Secondary | ICD-10-CM | POA: Insufficient documentation

## 2013-01-04 DIAGNOSIS — Z1239 Encounter for other screening for malignant neoplasm of breast: Secondary | ICD-10-CM

## 2013-01-04 NOTE — Progress Notes (Signed)
Complaints of clear colored spontaneous right nipple discharge.  Pap Smear:    Pap smear not completed today. Last Pap smear was 05/06/2010 at [facility] and normal. Per patient has a history of an abnormal Pap smear 30 years ago that required a repeat Pap smear for follow up that was normal. Last two Pap smear results are in EPIC.  Physical exam: Breasts Breasts symmetrical. No skin abnormalities bilateral breasts. No nipple retraction bilateral breasts. No nipple discharge left breast. Clear colored nipple discharge from right breast that is spontaneous at times. Specimen of right nipple discharge sent to Cytology. No lymphadenopathy. No lumps palpated bilateral breasts. No complaints of pain or tenderness on exam. Referred patient to the [facility] of [facility] for diagnostic mammogram. Appointment scheduled for Friday, January 14, 2013 at 0845.  Pelvic/Bimanual No Pap smear completed today since last Pap smear was 05/06/2010. Pap smear not indicated per BCCCP guidelines.

## 2013-01-04 NOTE — Patient Instructions (Signed)
Taught Alcide Goodness how to perform BSE and gave educational materials to take home. Patient did not need a Pap smear today due to last Pap smear was 05/06/2010. Told patient about free cervical cancer screenings to receive a Pap smear if would like one this fall. Let her know [facility] will cover Pap smears every 3 years unless has a history of abnormal Pap smears and that her next Pap smear is due January 2015. Referred patient to the [facility] of [facility] for diagnostic mammogram. Appointment scheduled for Friday, January 14, 2013 at 1610. Patient aware of appointment and will be there. Shaune Pascal I Henner verbalized understanding.  Arora Coakley, Kathaleen Maser, RN 9:45 AM

## 2013-01-14 ENCOUNTER — Ambulatory Visit
Admission: RE | Admit: 2013-01-14 | Discharge: 2013-01-14 | Disposition: A | Discharge status: Home / Self Care | Payer: No Typology Code available for payment source | Source: Ambulatory Visit | Attending: Obstetrics and Gynecology | Admitting: Obstetrics and Gynecology

## 2013-01-14 ENCOUNTER — Other Ambulatory Visit: Payer: Self-pay | Admitting: Obstetrics and Gynecology

## 2013-01-14 DIAGNOSIS — N6452 Nipple discharge: Secondary | ICD-10-CM

## 2013-01-19 ENCOUNTER — Ambulatory Visit (INDEPENDENT_AMBULATORY_CARE_PROVIDER_SITE_OTHER): Payer: Self-pay | Admitting: Surgery

## 2013-01-19 ENCOUNTER — Telehealth (HOSPITAL_COMMUNITY): Payer: Self-pay | Admitting: *Deleted

## 2013-01-19 NOTE — Telephone Encounter (Signed)
Patient contacted me in regards to her appointment at [facility]. Patient is upset for was told by [facility] that her consult was not covered. Attempted to call Okey Regal the Administrator at [facility] and left a voicemail for her to call me back. Patient was still at [facility] and put one of the employees on the phone and they stated would call me back.

## 2013-01-26 ENCOUNTER — Telehealth (HOSPITAL_COMMUNITY): Payer: Self-pay | Admitting: *Deleted

## 2013-01-26 NOTE — Telephone Encounter (Signed)
Called patient back in regards to her appointment at [facility]. Patient stated that she was not seen by [facility] and very upset. Patient stated that she was really upset and would like to be referred elsewhere. Let her know that I will contact [facility] about resources at [facility]. Told patient that [facility] is the only surgery office in [place] and the only office we have a contract with. Let patient know will call her back when get some answers. Patient verbalized understanding.

## 2013-01-28 ENCOUNTER — Encounter: Payer: Self-pay | Admitting: Obstetrics and Gynecology

## 2013-01-28 ENCOUNTER — Ambulatory Visit (INDEPENDENT_AMBULATORY_CARE_PROVIDER_SITE_OTHER): Payer: 59 | Admitting: Obstetrics and Gynecology

## 2013-01-28 VITALS — BP 176/94 | HR 73 | Temp 96.7°F | Ht 63.0 in | Wt 256.7 lb

## 2013-01-28 DIAGNOSIS — N924 Excessive bleeding in the premenopausal period: Secondary | ICD-10-CM

## 2013-01-28 DIAGNOSIS — Z01419 Encounter for gynecological examination (general) (routine) without abnormal findings: Secondary | ICD-10-CM

## 2013-01-28 DIAGNOSIS — I10 Essential (primary) hypertension: Secondary | ICD-10-CM | POA: Insufficient documentation

## 2013-01-28 MED ORDER — HYDROCHLOROTHIAZIDE 25 MG PO TABS: 25.0000 mg | ORAL_TABLET | Freq: Every day | ORAL | Status: AC

## 2013-01-28 NOTE — Progress Notes (Signed)
Patient ID: Alcide Goodness, female   DOB: 26-Feb-1966, 46 y.o.   MRN: [id number] Subjective:     [name] is a 47 y.o. female here for a routine exam.  Patient's last menstrual period was 11/25/2012. [id number] Current complaints: Mild vaginal discharge; no associated itching or odor.    Gynecologic History Patient's last menstrual period was 11/25/2012. Contraception: tubal ligation Last Pap: 2013. Results were: Normal Last mammogram: 01/14/13;Suspicious abnormality - biopsy should be considered.  Patient needs continued follow up for this.  This is being done through [facility].  Past Medical History  Diagnosis Date  . Hypertension     Past Surgical History  Procedure Laterality Date  . Dilation and curettage of uterus    . Cesarean section    . Tubal ligation      OB History   Grav Para Term Preterm Abortions TAB SAB Ect Mult Living   5 2 2  3 3    2       History   Social History  . Marital Status: Married    Spouse Name: N/A    Number of Children: N/A  . Years of Education: N/A   Social History Main Topics  . Smoking status: Never Smoker   . Smokeless tobacco: Never Used  . Alcohol Use: No  . Drug Use: No  . Sexual Activity: Yes    Birth Control/ Protection: Surgical   Other Topics Concern  . None   Social History Narrative  . None    Family History  Problem Relation Age of Onset  . Hypertension Mother      Review of Systems Constitutional: Negative for fever, chills, weight loss, malaise/fatigue and diaphoresis.  HENT: Negative for hearing loss, ear pain, nosebleeds, congestion, sore throat, neck pain, tinnitus and ear discharge.   Eyes: Negative for blurred vision, double vision, photophobia, pain, discharge and redness.  Respiratory: Negative for cough, hemoptysis, sputum production, shortness of breath, wheezing and stridor.   Cardiovascular: Negative for chest pain, palpitations, orthopnea, claudication, leg swelling and PND.   Gastrointestinal: negative for abdominal pain. Negative for heartburn, vomiting, diarrhea, constipation, blood in stool and melena.  Positive for nausea. Genitourinary: Negative for dysuria, urgency, frequency, hematuria and flank pain.  Musculoskeletal: Negative for myalgias, back pain, joint pain and falls.  Skin: Negative for itching and rash.  Neurological: Negative for dizziness, tingling, tremors, sensory change, speech change, focal weakness, seizures, loss of consciousness, weakness and headaches.  Endo/Heme/Allergies: Negative for environmental allergies and polydipsia. Does not bruise/bleed easily.  Psychiatric/Behavioral: Negative for depression, suicidal ideas, hallucinations, memory loss and substance abuse. The patient is not nervous/anxious and does not have insomnia.     Objective:    Physical Exam  Vitals reviewed. Constitutional: She is oriented to person, place, and time. She appears well-developed and well-nourished.  Cardiovascular: RRR. No m/r/g. Respiratory: CTAB. No rales, rhonchi, or wheezing.  GI: obese, soft, nontender, nondistended.  Genitourinary:  Breasts no masses skin changes or nipple changes bilaterally Vulva is normal without lesions Vagina is pink moist without discharge Cervix normal in appearance and pap is Done. Uterus is normal size.  Adnexa is negative.  Extremities: No LE edema.  Psychiatric: She has a normal mood and affect. Her behavior is normal. Judgment and thought content normal.    Assessment:    47 year old female here for annual exam and pap smear.   Plan:   Pap smear performed today. Patient being followed by [facility] for recent abnormal Mammogram findings.  Follow up in 1 year or earlier if needed. Patient in need of Family physician.

## 2013-01-28 NOTE — Progress Notes (Signed)
Referral to [facility] faxed

## 2013-01-28 NOTE — Assessment & Plan Note (Signed)
HCTZ refilled. 

## 2013-02-02 ENCOUNTER — Telehealth (HOSPITAL_COMMUNITY): Payer: Self-pay | Admitting: *Deleted

## 2013-02-02 NOTE — Telephone Encounter (Signed)
Called patient and gave her surgical consult appointment at [facility] for Tuesday, February 22, 2013 at 1000. Patients appointment is with Dr. Lovena Neighbours. Gave her all the contact information. Let her know if she receives any bills to bring me a copy. Patient verbalized understanding.

## 2013-02-03 ENCOUNTER — Telehealth (HOSPITAL_COMMUNITY): Payer: Self-pay | Admitting: *Deleted

## 2013-02-03 NOTE — Telephone Encounter (Signed)
Attempted to call patient to discuss the bills she sent me for [facility]. Left voicemail for patient to call me back.

## 2013-02-04 ENCOUNTER — Encounter: Payer: Self-pay | Admitting: *Deleted

## 2013-02-04 ENCOUNTER — Telehealth (HOSPITAL_COMMUNITY): Payer: Self-pay | Admitting: *Deleted

## 2013-02-04 NOTE — Telephone Encounter (Signed)
Called patient back to let her know that [facility] does not cover the ductogram that was completed at the [facility]. Let her know she needs to call the number on the bill. Told them they will work with her. Told her to call me back if they didn't and I will follow up. Let he know that the Breast Center will be sending her images to [facility] for her follow up appointment. Patient verbalized understanding.

## 2013-02-11 ENCOUNTER — Telehealth (HOSPITAL_COMMUNITY): Payer: Self-pay | Admitting: *Deleted

## 2013-02-11 NOTE — Telephone Encounter (Signed)
Patient called me and left a voicemail. Called patient back and she stated she has insurance now. Let her know she will need to file her insurance when she follows up with the surgeon. Let her know if she has a high deductible [facility] may still cover some services. Told her to let me know if she has a high deductible. Patient verbalized understanding.

## 2013-02-15 ENCOUNTER — Ambulatory Visit (HOSPITAL_COMMUNITY): Payer: Self-pay

## 2013-03-03 ENCOUNTER — Encounter: Payer: Self-pay | Admitting: *Deleted

## 2013-09-28 ENCOUNTER — Telehealth (HOSPITAL_COMMUNITY): Payer: Self-pay | Admitting: *Deleted

## 2013-09-28 NOTE — Telephone Encounter (Signed)
Attempted to call patient to let her know what [facility] will cover on the bill she submitted. No one answered the phone. Left voicemail for patient to call me back.

## 2013-09-28 NOTE — Telephone Encounter (Signed)
Patient called me back and let her know that [facility] will cover $420.59 of [facility]. I let her know that the check request has been sent and it may take a few weeks before payment is received. Patient verbalized understanding.

## 2014-02-13 ENCOUNTER — Encounter: Payer: Self-pay | Admitting: Obstetrics and Gynecology

## 2014-07-16 ENCOUNTER — Ambulatory Visit (INDEPENDENT_AMBULATORY_CARE_PROVIDER_SITE_OTHER): Payer: 59 | Admitting: Physician Assistant

## 2014-07-16 ENCOUNTER — Other Ambulatory Visit: Payer: Self-pay | Admitting: Physician Assistant

## 2014-07-16 VITALS — BP 124/82 | HR 77 | Temp 97.6°F | Resp 18 | Ht 63.0 in | Wt 275.6 lb

## 2014-07-16 DIAGNOSIS — I10 Essential (primary) hypertension: Secondary | ICD-10-CM | POA: Diagnosis not present

## 2014-07-16 DIAGNOSIS — Z6841 Body Mass Index (BMI) 40.0 and over, adult: Secondary | ICD-10-CM | POA: Diagnosis not present

## 2014-07-16 LAB — CBC WITH DIFFERENTIAL/PLATELET
BASOS ABS: 0 10*3/uL (ref 0.0–0.1)
Basophils Relative: 0 % (ref 0–1)
Eosinophils Absolute: 0 10*3/uL (ref 0.0–0.7)
Eosinophils Relative: 1 % (ref 0–5)
HEMATOCRIT: 37.1 % (ref 36.0–46.0)
Hemoglobin: 11.8 g/dL — ABNORMAL LOW (ref 12.0–15.0)
LYMPHS ABS: 1.9 10*3/uL (ref 0.7–4.0)
Lymphocytes Relative: 42 % (ref 12–46)
MCH: 25.1 pg — ABNORMAL LOW (ref 26.0–34.0)
MCHC: 31.8 g/dL (ref 30.0–36.0)
MCV: 78.8 fL (ref 78.0–100.0)
MPV: 10.3 fL (ref 8.6–12.4)
Monocytes Absolute: 0.4 10*3/uL (ref 0.1–1.0)
Monocytes Relative: 8 % (ref 3–12)
Neutro Abs: 2.3 10*3/uL (ref 1.7–7.7)
Neutrophils Relative %: 49 % (ref 43–77)
Platelets: 243 10*3/uL (ref 150–400)
RBC: 4.71 MIL/uL (ref 3.87–5.11)
RDW: 16.4 % — ABNORMAL HIGH (ref 11.5–15.5)
WBC: 4.6 10*3/uL (ref 4.0–10.5)

## 2014-07-16 LAB — COMPREHENSIVE METABOLIC PANEL
ALT: 15 U/L (ref 0–35)
AST: 15 U/L (ref 0–37)
Albumin: 3.8 g/dL (ref 3.5–5.2)
Alkaline Phosphatase: 67 U/L (ref 39–117)
BILIRUBIN TOTAL: 0.3 mg/dL (ref 0.2–1.2)
BUN: 19 mg/dL (ref 6–23)
CHLORIDE: 103 meq/L (ref 96–112)
CO2: 30 mEq/L (ref 19–32)
Calcium: 9.2 mg/dL (ref 8.4–10.5)
Creat: 0.68 mg/dL (ref 0.50–1.10)
GLUCOSE: 83 mg/dL (ref 70–99)
Potassium: 3.8 mEq/L (ref 3.5–5.3)
Sodium: 140 mEq/L (ref 135–145)
Total Protein: 7.2 g/dL (ref 6.0–8.3)

## 2014-07-16 LAB — TSH: TSH: 1.368 u[IU]/mL (ref 0.350–4.500)

## 2014-07-16 MED ORDER — HYDROCHLOROTHIAZIDE 25 MG PO TABS: 25.0000 mg | ORAL_TABLET | Freq: Every day | ORAL | Status: DC

## 2014-07-16 NOTE — Patient Instructions (Signed)
I will contact you with your lab results as soon as they are available.   If you have not heard from me in 2 weeks, please contact me.  The fastest way to get your results is to register for My Chart (see the instructions on the last page of this printout).   

## 2014-07-16 NOTE — Progress Notes (Signed)
  Medical screening examination/treatment/procedure(s) were performed by non-physician practitioner and as supervising physician I was immediately available for consultation/collaboration.     

## 2014-07-16 NOTE — Progress Notes (Signed)
Patient ID: [name], female    DOB: [birth year], 49 y.o.   MRN: [id number]  PCP: Tally DueGUEST, CHRIS WARREN, MD  Subjective:   Chief Complaint  Patient presents with  . Medication Refill    Was taking HCTZ 25mg  about 2 years ago.     HPI  Was able to stop HCTZ several years ago with lifestyle changes. 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 Ate a lot more. Is currently the heaviest now that she's ever been, even when pregnant. She started back on the HCTZ and her GYN advised she needed to follow up.   Review of Systems Review of Systems  Constitutional: Positive for unexpected weight change (gain, associated with working 3rd shift).  Respiratory: Negative.   Cardiovascular: Negative.   Gastrointestinal: Negative.   Endocrine: Negative.   Genitourinary: Negative.   Musculoskeletal: Negative.   Skin: Negative.   Neurological: Negative.        Patient Active Problem List   Diagnosis Date Noted  . BMI 45.0-49.9, adult 07/16/2014  . HTN (hypertension) 01/28/2013  . Breast discharge 01/04/2013  . Axillary lump 02/10/2012  . Abnormal perimenopausal bleeding 12/12/2011     Prior to Admission medications   Medication Sig Start Date End Date Taking? Authorizing Provider  hydrochlorothiazide (HYDRODIURIL) 25 MG tablet Take 25 mg by mouth daily.   Yes Historical Provider, MD     No Known Allergies     Objective:  Physical Exam  Physical Exam  Constitutional: She is oriented to person, place, and time. Vital signs are normal. She appears well-developed and well-nourished. She is active and cooperative. No distress.  BP 124/82 mmHg  Pulse 77  Temp(Src) 97.6 F (36.4 C) (Oral)  Resp 18  Ht 5\' 3"  (1.6 m)  Wt 275 lb 9.6 oz (125.011 kg)  BMI 48.83 kg/m2  SpO2 97%  LMP 05/17/2014  HENT:  Head: Normocephalic and atraumatic.  Right Ear: Hearing normal.  Left Ear: Hearing normal.  Eyes: Conjunctivae are normal. No scleral icterus.  Neck:  Normal range of motion. Neck supple. No thyromegaly present.  Cardiovascular: Normal rate, regular rhythm and normal heart sounds.   Pulses:      Radial pulses are 2+ on the right side, and 2+ on the left side.  Pulmonary/Chest: Effort normal and breath sounds normal.  Lymphadenopathy:       Head (right side): No tonsillar, no preauricular, no posterior auricular and no occipital adenopathy present.       Head (left side): No tonsillar, no preauricular, no posterior auricular and no occipital adenopathy present.    She has no cervical adenopathy.       Right: No supraclavicular adenopathy present.       Left: No supraclavicular adenopathy present.  Neurological: She is alert and oriented to person, place, and time. No sensory deficit.  Skin: Skin is warm, dry and intact. No rash noted. No cyanosis or erythema. Nails show no clubbing.  Psychiatric: She has a normal mood and affect. Her speech is normal and behavior is normal.           Assessment & Plan:  1. Essential hypertension Controlled on current treatment. Continue HCTZ. Consider d/c if BP improves with lifestyle modification. - CBC with Differential/Platelet - Comprehensive metabolic panel - TSH - hydrochlorothiazide (HYDRODIURIL) 25 MG tablet; Take 1 tablet (25 mg total) by mouth daily.  Dispense: 180 tablet; Refill: 1  2. BMI 45.0-49.9, adult Resume healthy eating and regular  exercise.   Return in about 6 months (around 01/15/2015).   Fernande Bras, PA-C Physician Assistant-Certified [facility]

## 2014-07-17 ENCOUNTER — Encounter: Payer: Self-pay | Admitting: Physician Assistant

## 2014-10-31 ENCOUNTER — Ambulatory Visit (INDEPENDENT_AMBULATORY_CARE_PROVIDER_SITE_OTHER): Payer: 59

## 2014-10-31 ENCOUNTER — Emergency Department (HOSPITAL_COMMUNITY): Payer: 59

## 2014-10-31 ENCOUNTER — Ambulatory Visit (INDEPENDENT_AMBULATORY_CARE_PROVIDER_SITE_OTHER): Payer: 59 | Admitting: Family Medicine

## 2014-10-31 ENCOUNTER — Encounter (HOSPITAL_COMMUNITY): Payer: Self-pay | Admitting: Physical Medicine and Rehabilitation

## 2014-10-31 ENCOUNTER — Emergency Department (HOSPITAL_COMMUNITY)
Admission: EM | Admit: 2014-10-31 | Discharge: 2014-10-31 | Disposition: A | Discharge status: Home / Self Care | Payer: 59 | Attending: Emergency Medicine | Admitting: Emergency Medicine

## 2014-10-31 VITALS — BP 138/90 | HR 75 | Temp 98.1°F | Resp 20 | Ht 64.75 in | Wt 288.4 lb

## 2014-10-31 DIAGNOSIS — R9431 Abnormal electrocardiogram [ECG] [EKG]: Secondary | ICD-10-CM

## 2014-10-31 DIAGNOSIS — M25512 Pain in left shoulder: Secondary | ICD-10-CM

## 2014-10-31 DIAGNOSIS — I10 Essential (primary) hypertension: Secondary | ICD-10-CM | POA: Insufficient documentation

## 2014-10-31 DIAGNOSIS — R197 Diarrhea, unspecified: Secondary | ICD-10-CM | POA: Insufficient documentation

## 2014-10-31 DIAGNOSIS — R079 Chest pain, unspecified: Secondary | ICD-10-CM | POA: Insufficient documentation

## 2014-10-31 DIAGNOSIS — R112 Nausea with vomiting, unspecified: Secondary | ICD-10-CM

## 2014-10-31 DIAGNOSIS — Z79899 Other long term (current) drug therapy: Secondary | ICD-10-CM | POA: Insufficient documentation

## 2014-10-31 DIAGNOSIS — R140 Abdominal distension (gaseous): Secondary | ICD-10-CM | POA: Insufficient documentation

## 2014-10-31 LAB — COMPREHENSIVE METABOLIC PANEL
ALT: 24 U/L (ref 14–54)
AST: 26 U/L (ref 15–41)
Albumin: 3.6 g/dL (ref 3.5–5.0)
Alkaline Phosphatase: 64 U/L (ref 38–126)
Anion gap: 8 (ref 5–15)
BILIRUBIN TOTAL: 0.8 mg/dL (ref 0.3–1.2)
BUN: 10 mg/dL (ref 6–20)
CO2: 27 mmol/L (ref 22–32)
Calcium: 8.7 mg/dL — ABNORMAL LOW (ref 8.9–10.3)
Chloride: 102 mmol/L (ref 101–111)
Creatinine, Ser: 0.7 mg/dL (ref 0.44–1.00)
Glucose, Bld: 97 mg/dL (ref 65–99)
POTASSIUM: 3.8 mmol/L (ref 3.5–5.1)
Sodium: 137 mmol/L (ref 135–145)
Total Protein: 7.7 g/dL (ref 6.5–8.1)

## 2014-10-31 LAB — CBC WITH DIFFERENTIAL/PLATELET
Basophils Absolute: 0 10*3/uL (ref 0.0–0.1)
Basophils Relative: 0 % (ref 0–1)
Eosinophils Absolute: 0 10*3/uL (ref 0.0–0.7)
Eosinophils Relative: 0 % (ref 0–5)
HEMATOCRIT: 34 % — AB (ref 36.0–46.0)
Hemoglobin: 11.3 g/dL — ABNORMAL LOW (ref 12.0–15.0)
Lymphocytes Relative: 24 % (ref 12–46)
Lymphs Abs: 1.2 10*3/uL (ref 0.7–4.0)
MCH: 27.3 pg (ref 26.0–34.0)
MCHC: 33.2 g/dL (ref 30.0–36.0)
MCV: 82.1 fL (ref 78.0–100.0)
Monocytes Absolute: 0.3 10*3/uL (ref 0.1–1.0)
Monocytes Relative: 5 % (ref 3–12)
NEUTROS PCT: 71 % (ref 43–77)
Neutro Abs: 3.7 10*3/uL (ref 1.7–7.7)
Platelets: 229 10*3/uL (ref 150–400)
RBC: 4.14 MIL/uL (ref 3.87–5.11)
RDW: 15.1 % (ref 11.5–15.5)
WBC: 5.2 10*3/uL (ref 4.0–10.5)

## 2014-10-31 LAB — I-STAT TROPONIN, ED
Troponin i, poc: 0 ng/mL (ref 0.00–0.08)
Troponin i, poc: 0 ng/mL (ref 0.00–0.08)

## 2014-10-31 LAB — GLUCOSE, POCT (MANUAL RESULT ENTRY): POC GLUCOSE: 96 mg/dL (ref 70–99)

## 2014-10-31 LAB — POCT GLYCOSYLATED HEMOGLOBIN (HGB A1C): Hemoglobin A1C: 6.2

## 2014-10-31 MED ORDER — ONDANSETRON 4 MG PO TBDP
4.0000 mg | ORAL_TABLET | Freq: Once | ORAL | Status: AC
  Administered 2014-10-31: 4 mg via ORAL

## 2014-10-31 NOTE — ED Notes (Signed)
Pt presents to department for evaluation of L shoulder pain and nausea. Ongoing x1 day. Was sent to ED from PCP for abnormal EKG and further evaluation. 3/10 L shoulder pain upon arrival to ED. Respirations unlabored. Pt is alert and oriented x4.

## 2014-10-31 NOTE — Patient Instructions (Signed)
We would really like to have you transported at EMS, but will understand your choice.   Please go to [facility] immediately.  They will issue your information.

## 2014-10-31 NOTE — Progress Notes (Signed)
[facility] [address], [place] [address] [phone]  Date:  10/31/2014   Name:  [name]   DOB:  12-07-1965   MRN:  [id number]  PCP:  Tally Due, MD    History of Present Illness:  [name] is a 49 y.o. female patient who presents to [facility] for chief complaint of left shoulder pain that started 2 days ago. Patient was at work while typing and noticed an aching pain of her anterior shoulder. She also noticed a great stiffness and it. She claims to have associated nausea and vomiting with her shoulder pain but no shortness of breath, diaphoresis, dizziness, chest pain or, palpitations with her symptoms. She also had diarrhea. There is no fever or abdominal pain. Patient has started a new diet with meat restrictions.  She had taken some Bayer body aches and pain. She states that the left shoulder plain had told and she was able to have full range of motion in her shoulders for at least 6 hours but the pain soon returned that evening.   Patient Active Problem List   Diagnosis Date Noted  . BMI 45.0-49.9, adult 07/16/2014  . HTN (hypertension) 01/28/2013  . Breast discharge 01/04/2013  . Axillary lump 02/10/2012  . Abnormal perimenopausal bleeding 12/12/2011    Past Medical History  Diagnosis Date  . Hypertension     Past Surgical History  Procedure Laterality Date  . Dilation and curettage of uterus    . Cesarean section    . Tubal ligation      History  Substance Use Topics  . Smoking status: Never Smoker   . Smokeless tobacco: Never Used  . Alcohol Use: No    Family History  Problem Relation Age of Onset  . Hypertension Mother   . Lung cancer Sister     No Known Allergies  Medication list has been reviewed and updated.  Current Outpatient Prescriptions on File Prior to Visit  Medication Sig Dispense Refill  . hydrochlorothiazide (HYDRODIURIL) 25 MG tablet Take 1 tablet (25 mg total) by mouth daily. 180 tablet  1   No current facility-administered medications on file prior to visit.    ROS  ROS otherwise unremarkable unless listed above.  Physical Examination: BP 138/90 mmHg  Pulse 75  Temp(Src) 98.1 F (36.7 C) (Oral)  Resp 20  Ht 5' 4.75" (1.645 m)  Wt 288 lb 6.4 oz (130.817 kg)  BMI 48.34 kg/m2  SpO2 98%  LMP 05/17/2014 Ideal Body Weight: Weight in (lb) to have BMI = 25: 148.8  Physical Exam Alert, cooperative, oriented 4 in no acute distress. Normal breath sounds without wheezing or rhonchi. Regular rate and rhythm without murmurs, rubs, or gallops.  No cervical spinous tenderness nor thoracic. Normal range of motion at neck. There is some tenderness along the deltoid. Pain with external rotation at 100. Positive speeds test.      Results for orders placed or performed in visit on 10/31/14  POCT glycosylated hemoglobin (Hb A1C)  Result Value Ref Range   Hemoglobin A1C 6.2   POCT glucose (manual entry)  Result Value Ref Range   POC Glucose 96 70 - 99 mg/dl   EKG: ST changes at the lateral   Left shoulder XR [facility] reading (PRIMARY) by  Dr. Milus Glazier: Normal  Assessment and Plan: 49 year old female is here with past medical history of hypertension and chief complaint of left shoulder plan pain she also has associated nausea, vomiting and  diarrhea. EKG was performed and cannot rule out possible cardiac event.  Thyroid and CMET were normal about 3 months ago.  Advised and insisted that she go by EMS however she declines. I have told her to [facility]. If this is normal, will treat her left shoulder pain separately. Will await results.  Abnormal EKG  Left shoulder pain - Plan: EKG 12-Lead, DG Shoulder Left, POCT CBC  Non-intractable vomiting with nausea, vomiting of unspecified type - Plan: POCT glycosylated hemoglobin (Hb A1C), POCT glucose (manual entry), POCT CBC, ondansetron (ZOFRAN-ODT) disintegrating tablet 4 mg, CANCELED: COMPLETE METABOLIC PANEL WITH  GFR  Trena PlattStephanie Meghan Warshawsky, PA-C [facility] 7/19/201612:07 PM

## 2014-10-31 NOTE — ED Provider Notes (Signed)
CSN: [id number]     Arrival date & time 10/31/14  1220 History   First MD Initiated Contact with Patient 10/31/14 1513     Chief Complaint  Patient presents with  . Chest Pain   (Consider location/radiation/quality/duration/timing/severity/associated sxs/prior Treatment) HPI Patient is a 49 year old female with a history of hypertension presenting today for 2 days of left shoulder pain and nausea, vomiting, diarrhea for the past 2 days as well. Patient became worried wall began having shoulder pain while at work today and was accompanied by nausea vomiting and diarrhea. She went to her PCP for EKG performed showing T-wave inversion in inferior and lateral leads. Subsequent sent to the emergency department for further evaluation. Patient denies any frank chest pain pressure, shortness of breath, palpitations, near syncope. Vomiting is NBNB and associated with no abdominal pain.    Past Medical History  Diagnosis Date  . Hypertension    Past Surgical History  Procedure Laterality Date  . Dilation and curettage of uterus    . Cesarean section    . Tubal ligation     Family History  Problem Relation Age of Onset  . Hypertension Mother   . Lung cancer Sister    History  Substance Use Topics  . Smoking status: Never Smoker   . Smokeless tobacco: Never Used  . Alcohol Use: No   OB History    Gravida Para Term Preterm AB TAB SAB Ectopic Multiple Living   5 2 2  3 3    2      Review of Systems  Constitutional: Negative for fever and chills.  HENT: Negative for congestion and sore throat.   Eyes: Negative for pain.  Respiratory: Negative for cough and shortness of breath.   Cardiovascular: Negative for chest pain, palpitations and leg swelling.  Gastrointestinal: Positive for nausea and vomiting. Negative for abdominal pain and diarrhea.  Genitourinary: Negative for dysuria and flank pain.  Musculoskeletal: Positive for arthralgias (left shoulder pain). Negative for back pain and  neck pain.  Skin: Negative for rash.  Allergic/Immunologic: Negative.   Neurological: Negative for dizziness and light-headedness.  Psychiatric/Behavioral: Negative for confusion.   Allergies  Review of patient's allergies indicates no known allergies.  Home Medications   Prior to Admission medications   Medication Sig Start Date End Date Taking? Authorizing Provider  hydrochlorothiazide (HYDRODIURIL) 25 MG tablet Take 1 tablet (25 mg total) by mouth daily. 07/16/14   Chelle Jeffery, PA-C   BP 133/74 mmHg  Pulse 62  Temp(Src) 97.8 F (36.6 C) (Oral)  Resp 14  SpO2 97%  LMP 05/17/2014 Physical Exam  Constitutional: She is oriented to person, place, and time. She appears well-developed and well-nourished. No distress.  HENT:  Head: Normocephalic and atraumatic.  Eyes: Conjunctivae and EOM are normal. Pupils are equal, round, and reactive to light.  Neck: Normal range of motion. Neck supple.  Cardiovascular: Normal rate, regular rhythm, S1 normal, S2 normal and normal heart sounds.   Pulses:      Radial pulses are 2+ on the right side, and 2+ on the left side.  Pulmonary/Chest: Effort normal and breath sounds normal. No respiratory distress.  Abdominal: Soft. Bowel sounds are normal. She exhibits distension. There is no tenderness. There is no rigidity, no rebound, no guarding, no CVA tenderness, no tenderness at McBurney's point and negative Murphy's sign.  Musculoskeletal:       Left shoulder: She exhibits decreased range of motion and tenderness. She exhibits no bony tenderness, no swelling, no effusion,  no crepitus, no deformity, no laceration, no pain, no spasm, normal pulse and normal strength.  Neurological: She is alert and oriented to person, place, and time. She has normal reflexes. No cranial nerve deficit.  Skin: Skin is warm and dry. She is not diaphoretic.  Psychiatric: She has a normal mood and affect.    ED Course  Procedures (including critical care time) Labs  Review Labs Reviewed  CBC WITH DIFFERENTIAL/PLATELET - Abnormal; Notable for the following:    Hemoglobin 11.3 (*)    HCT 34.0 (*)    All other components within normal limits  COMPREHENSIVE METABOLIC PANEL - Abnormal; Notable for the following:    Calcium 8.7 (*)    All other components within normal limits  I-STAT TROPOININ, ED    Imaging Review Dg Chest 2 View  10/31/2014   CLINICAL DATA:  Chest pain, left shoulder pain, nausea.  EXAM: CHEST  2 VIEW  COMPARISON:  05/26/2006  FINDINGS: Low lung volumes. Heart and mediastinal contours are within normal limits. No focal opacities or effusions. No acute bony abnormality.  IMPRESSION: No active cardiopulmonary disease.   Electronically Signed   By: Charlett NoseKevin  Dover M.D.   On: 10/31/2014 13:13   Dg Shoulder Left  10/31/2014   CLINICAL DATA:  Left shoulder pain since yesterday.  EXAM: LEFT SHOULDER - 2+ VIEW  COMPARISON:  None.  FINDINGS: There is no fracture or dislocation. There is slight arthritis of the acromioclavicular joint. No soft tissue calcifications.  IMPRESSION: Slight AC joint arthropathy.   Electronically Signed   By: Francene BoyersJames  Maxwell M.D.   On: 10/31/2014 13:41     EKG Interpretation   Date/Time:  Tuesday October 31 2014 12:33:38 EDT Ventricular Rate:  71 PR Interval:  176 QRS Duration: 80 QT Interval:  418 QTC Calculation: 454 R Axis:   27 Text Interpretation:  Sinus rhythm with Premature atrial complexes T wave  abnormality, consider inferior ischemia Abnormal ECG No acute changes  Confirmed by Rhunette CroftNANAVATI, MD, Janey GentaANKIT [phone]([id number]) on 10/31/2014 3:14:10 PM      MDM   Final diagnoses:  None    Patient is a 49 year old female with a history of hypertension presented today for left shoulder pain, nausea, vomiting, diarrhea and abnormal EKG changes at PCP.  On initial evaluation patient was hemodynamically stable and in no acute distress. She admitted to mild left shoulder pain at the time. Denied any frank chest pain or pressure.  EKG repeated showing inverted T waves in the inferior and lateral leads. Initial troponin negative. Delta troponin negative. No arrhythmia or arrhythmic genic potential on the EKG.  No shortness of breath or pleuritic nature of any chest pain. No tachycardia or hypoxia and doubt PE at this time. Doubt dissection.  Chest x-ray with no pneumonia or pneumothorax. Doubt cardiac etiology for shoulder pain at this time. Left shoulder x-ray negative for acute fractures Pain reproducible with palpation and range of motion of the shoulder. Left upper extremity neurovascularly intact.  Patient reports sick contacts at work and has had nausea vomiting and diarrhea. Was likely consistent with viral gastroenteritis. Patient encouraged to intake fluids.   Cardiology was contacted and patient to be set up for a stress test for EKG changes within the next 1-2 days. Given strict return precautions which patient understood.  If performed, labs, EKGs, and imaging were reviewed/interpreted by myself and my attending and incorporated into medical decision making.  Discussed pertinent finding with patient or caregiver prior to discharge with no further questions.  Immediate return precautions given and pt or caregiver reports understanding.  Pt care supervised by my attending Dr. Rozelle Logan, MD PGY-2  Emergency Medicine     Tery Sanfilippo, MD 11/01/14 0151  Derwood Kaplan, MD 11/02/14 0040

## 2014-11-01 LAB — COMPLETE METABOLIC PANEL WITH GFR
ALK PHOS: 63 U/L (ref 39–117)
ALT: 20 U/L (ref 0–35)
AST: 20 U/L (ref 0–37)
Albumin: 3.8 g/dL (ref 3.5–5.2)
BILIRUBIN TOTAL: 0.3 mg/dL (ref 0.2–1.2)
BUN: 12 mg/dL (ref 6–23)
CO2: 29 mEq/L (ref 19–32)
Calcium: 8.9 mg/dL (ref 8.4–10.5)
Chloride: 102 mEq/L (ref 96–112)
Creat: 0.68 mg/dL (ref 0.50–1.10)
GFR, Est Non African American: >89 mL/min
Glucose, Bld: 103 mg/dL — ABNORMAL HIGH (ref 70–99)
Potassium: 4 mEq/L (ref 3.5–5.3)
Sodium: 141 mEq/L (ref 135–145)
Total Protein: 7.2 g/dL (ref 6.0–8.3)

## 2014-12-11 ENCOUNTER — Other Ambulatory Visit: Payer: Self-pay | Admitting: Obstetrics and Gynecology

## 2014-12-21 ENCOUNTER — Ambulatory Visit: Payer: 59 | Admitting: Internal Medicine

## 2015-01-29 ENCOUNTER — Encounter: Payer: Self-pay | Admitting: Cardiology

## 2015-01-29 ENCOUNTER — Ambulatory Visit (INDEPENDENT_AMBULATORY_CARE_PROVIDER_SITE_OTHER): Payer: 59 | Admitting: Cardiology

## 2015-01-29 VITALS — BP 124/64 | HR 78 | Ht 64.75 in | Wt 284.0 lb

## 2015-01-29 DIAGNOSIS — I10 Essential (primary) hypertension: Secondary | ICD-10-CM

## 2015-01-29 DIAGNOSIS — R9431 Abnormal electrocardiogram [ECG] [EKG]: Secondary | ICD-10-CM

## 2015-01-29 NOTE — Patient Instructions (Addendum)
Medication Instructions:   Your physician recommends that you continue on your current medications as directed. Please refer to the Current Medication list given to you today.    Your physician recommends that you return for lab work in: PLEASE CALL US BACK AT [phone] TO HAVE THIS LAB SCHEDULED PER DR NELSON, ONCE YOU FIND OUT IF YOUR PCP HAS DRAWN YOUR CHOLESTEROL OR NOT---NMR WITH LIPIDS ([id number])     Testing/Procedures:  Your physician has requested that you have en exercise stress myoview. For further information please visit [email or web address]. Please follow instruction sheet, as given.     Follow-Up:  Your physician wants you to follow-up in: ONE YEAR WITH DR Johnell ComingsNELSON You will receive a reminder letter in the mail two months in advance. If you don't receive a letter, please call our office to schedule the follow-up appointment.

## 2015-01-29 NOTE — Progress Notes (Signed)
Patient ID: [name], female   DOB: [birth year], 49 y.o.   MRN: [id number]      Cardiology Office Note   Date:  01/29/2015   ID:  [name], DOB 1965/04/30, MRN [id number]  PCP:  No PCP Per Patient  Cardiologist:   Lars Masson, MD   Chief complain: Chest pain   History of Present Illness: CAI [name] is a 49 y.o. female who presents for evaluation of left shoulder pain  Associated with nausea. She went to the ER in July 2016 and was ruled out for MI.  Patient was at work while typing and noticed an aching pain of her anterior shoulder. She also noticed a great stiffness and it. She claims to have associated nausea and vomiting with her shoulder pain but no shortness of breath, diaphoresis, dizziness, chest pain or, palpitations with her symptoms. She also had diarrhea.  She is obese and not physically active, works night shifts.  No similar pain since then, no SOB, but DOE with moderate exertion. No FH of premature CAD, no smoking.   Past Medical History  Diagnosis Date  . Hypertension     Past Surgical History  Procedure Laterality Date  . Dilation and curettage of uterus    . Cesarean section    . Tubal ligation       Current Outpatient Prescriptions  Medication Sig Dispense Refill  . aspirin 325 MG tablet Take 325 mg by mouth every 6 (six) hours as needed for mild pain.    . hydrochlorothiazide (HYDRODIURIL) 25 MG tablet Take 1 tablet (25 mg total) by mouth daily. 180 tablet 1   No current facility-administered medications for this visit.   Allergies:   Review of patient's allergies indicates no known allergies.   Social History:  The patient  reports that she has never smoked. She has never used smokeless tobacco. She reports that she does not drink alcohol or use illicit drugs.   Family History:  The patient's family history includes Hypertension in her mother; Lung cancer in her sister.   ROS:  Please see the history of present  illness.   Otherwise, review of systems are positive for none.   All other systems are reviewed and negative.   PHYSICAL EXAM: VS:  BP 124/64 mmHg  Pulse 78  Ht 5' 4.75" (1.645 m)  Wt 284 lb (128.822 kg)  BMI 47.61 kg/m2 , BMI Body mass index is 47.61 kg/(m^2). GEN: Well nourished, well developed, in no acute distress HEENT: normal Neck: no JVD, carotid bruits, or masses Cardiac:RRR; no murmurs, rubs, or gallops,no edema  Respiratory:  clear to auscultation bilaterally, normal work of breathing GI: soft, nontender, nondistended, + BS MS: no deformity or atrophy Skin: warm and dry, no rash Neuro:  Strength and sensation are intact Psych: euthymic mood, full affect  EKG:  EKG is not ordered today. The ekg on 11/01/2014 shows SR, negative T waves in the inferolateral leads.  Recent Labs: 07/16/2014: TSH 1.368 10/31/2014: ALT 24; BUN 10; Creatinine, Ser 0.70; Hemoglobin 11.3*; Platelets 229; Potassium 3.8; Sodium 137   Lipid Panel No results found for: CHOL, TRIG, HDL, CHOLHDL, VLDL, LDLCALC, LDLDIRECT   Wt Readings from Last 3 Encounters:  01/29/15 284 lb (128.822 kg)  10/31/14 288 lb 6.4 oz (130.817 kg)  07/16/14 275 lb 9.6 oz (125.011 kg)     ASSESSMENT AND PLAN:  1. Shoulder pain - with abnormal ECG - negative T waves in the inferolateral leads suspicious for  ischemia, we will schedule an exercise nuclear stress test  2. Hypertension- well controlled on hydrochlorothiazide, we will continue   3. Lipids - not known, we will check NMR lipid profile.  Follow up in 1 year if normal stress test.   Signed, Lars MassonNELSON, Garlan Drewes H, MD  01/29/2015 3:33 PM    [facility] [address], [place], [place]  [address] Phone: [phone]; Fax: [phone]

## 2015-05-31 ENCOUNTER — Ambulatory Visit (INDEPENDENT_AMBULATORY_CARE_PROVIDER_SITE_OTHER): Payer: 59 | Admitting: Physician Assistant

## 2015-05-31 VITALS — BP 138/80 | HR 79 | Temp 98.4°F | Resp 16 | Ht 65.0 in | Wt 266.0 lb

## 2015-05-31 DIAGNOSIS — A059 Bacterial foodborne intoxication, unspecified: Secondary | ICD-10-CM

## 2015-05-31 NOTE — Patient Instructions (Addendum)

## 2015-05-31 NOTE — Progress Notes (Signed)
   05/31/2015 11:51 AM   DOB: July 06, 1965 / MRN: [id number]  SUBJECTIVE:  [name] is a 50 y.o. female presenting for abdominal pain that started last night. Associates nausea, diarrhea, emesis. Denies fever, hematochezia, and bloody emesis.   She thinks this happened due to some quinoa that she ate last night.  She feels well right now and has eaten a banana and ginger ale without difficulty. She has tried peptobismal which has been helpful, complains her tongue is black.    She has No Known Allergies.   She  has a past medical history of Hypertension.    She  reports that she has never smoked. She has never used smokeless tobacco. She reports that she does not drink alcohol or use illicit drugs. She  reports that she currently engages in sexual activity and has had female partners. She reports using the following method of birth control/protection: Surgical. The patient  has past surgical history that includes Dilation and curettage of uterus; Cesarean section; and Tubal ligation.  Her family history includes Hypertension in her mother; Lung cancer in her sister.  Review of Systems  Constitutional: Negative for fever and chills.  Eyes: Negative for blurred vision.  Respiratory: Negative for cough and shortness of breath.   Cardiovascular: Negative for chest pain.  Gastrointestinal: Negative for nausea and abdominal pain.  Genitourinary: Negative for dysuria, urgency and frequency.  Musculoskeletal: Negative for myalgias.  Skin: Negative for rash.  Neurological: Negative for dizziness, tingling and headaches.  Psychiatric/Behavioral: Negative for depression. The patient is not nervous/anxious.     Problem list and medications reviewed and updated by myself where necessary, and exist elsewhere in the encounter.   OBJECTIVE:  BP 138/80 mmHg  Pulse 79  Temp(Src) 98.4 F (36.9 C) (Oral)  Resp 16  Ht  (1.651 m)  Wt 266 lb (120.657 kg)  BMI 44.26 kg/m2  SpO2 98%  LMP  04/30/2015 (Approximate)  Physical Exam  Constitutional: She is oriented to person, place, and time. She appears well-nourished. No distress.  Eyes: EOM are normal. Pupils are equal, round, and reactive to light.  Cardiovascular: Normal rate.   Pulmonary/Chest: Effort normal.  Abdominal: Soft. Bowel sounds are normal. She exhibits no distension and no mass. There is no tenderness. There is no rebound and no guarding.  Neurological: She is alert and oriented to person, place, and time. No cranial nerve deficit. Gait normal.  Skin: Skin is dry. She is not diaphoretic.  Psychiatric: She has a normal mood and affect.  Vitals reviewed.   No results found for this or any previous visit (from the past 72 hour(s)).  No results found.  ASSESSMENT AND PLAN  Tyffany was seen today for abdominal pain, diarrhea, emesis and tongue looks discolored.  Diagnoses and all orders for this visit:  Food poisoning: Symptoms have largely resolved and her exam is normal.  Advised BRAT diet over the next few days.  RTC as needed.    The patient was advised to call or return to clinic if she does not see an improvement in symptoms or to seek the care of the closest emergency department if she worsens with the above plan.   Deliah Boston, MHS, PA-C [facility] 05/31/2015 11:51 AM

## 2015-07-08 ENCOUNTER — Other Ambulatory Visit: Payer: Self-pay | Admitting: Physician Assistant

## 2015-07-08 ENCOUNTER — Telehealth: Payer: Self-pay | Admitting: *Deleted

## 2015-07-08 NOTE — Telephone Encounter (Signed)
Spoke with patient her heart doctor has prescribed her this.  States she has a follow up with him. Does not need refils

## 2016-09-25 ENCOUNTER — Other Ambulatory Visit: Payer: Self-pay | Admitting: Obstetrics and Gynecology

## 2017-03-11 ENCOUNTER — Encounter (HOSPITAL_COMMUNITY): Payer: Self-pay

## 2017-03-17 ENCOUNTER — Emergency Department (HOSPITAL_COMMUNITY): Payer: 59

## 2017-03-17 ENCOUNTER — Emergency Department (HOSPITAL_COMMUNITY)
Admission: EM | Admit: 2017-03-17 | Discharge: 2017-03-18 | Disposition: A | Discharge status: Home / Self Care | Payer: 59 | Attending: Emergency Medicine | Admitting: Emergency Medicine

## 2017-03-17 ENCOUNTER — Encounter (HOSPITAL_COMMUNITY): Payer: Self-pay | Admitting: Emergency Medicine

## 2017-03-17 DIAGNOSIS — R0789 Other chest pain: Secondary | ICD-10-CM | POA: Diagnosis present

## 2017-03-17 DIAGNOSIS — Z79899 Other long term (current) drug therapy: Secondary | ICD-10-CM | POA: Diagnosis not present

## 2017-03-17 DIAGNOSIS — R072 Precordial pain: Secondary | ICD-10-CM | POA: Insufficient documentation

## 2017-03-17 DIAGNOSIS — I10 Essential (primary) hypertension: Secondary | ICD-10-CM | POA: Insufficient documentation

## 2017-03-17 LAB — BASIC METABOLIC PANEL
ANION GAP: 8 (ref 5–15)
BUN: 19 mg/dL (ref 6–20)
CALCIUM: 9 mg/dL (ref 8.9–10.3)
CO2: 27 mmol/L (ref 22–32)
Chloride: 102 mmol/L (ref 101–111)
Creatinine, Ser: 0.69 mg/dL (ref 0.44–1.00)
GFR calc Af Amer: >60 mL/min (ref >60)
GLUCOSE: 129 mg/dL — AB (ref 65–99)
Potassium: 3.5 mmol/L (ref 3.5–5.1)
Sodium: 137 mmol/L (ref 135–145)

## 2017-03-17 LAB — CBC
HCT: 40 % (ref 36.0–46.0)
HEMOGLOBIN: 13.3 g/dL (ref 12.0–15.0)
MCH: 28.1 pg (ref 26.0–34.0)
MCHC: 33.3 g/dL (ref 30.0–36.0)
MCV: 84.4 fL (ref 78.0–100.0)
Platelets: 225 10*3/uL (ref 150–400)
RBC: 4.74 MIL/uL (ref 3.87–5.11)
RDW: 14.4 % (ref 11.5–15.5)
WBC: 4.9 10*3/uL (ref 4.0–10.5)

## 2017-03-17 LAB — I-STAT TROPONIN, ED: TROPONIN I, POC: 0 ng/mL (ref 0.00–0.08)

## 2017-03-17 NOTE — ED Triage Notes (Signed)
Patient reports she had an episode of chest pain this am and took 2 aspirin. States pain is intermittent and has had about 2 more episodes. Denies shortness of breath, nausea or any other associating symptoms.

## 2017-03-18 NOTE — Discharge Instructions (Signed)

## 2017-03-18 NOTE — ED Provider Notes (Signed)
[facility]-EMERGENCY DEPT Provider Note   CSN: [id number] Arrival date & time: 03/17/17  1647     History   Chief Complaint Chief Complaint  Patient presents with  . Chest Pain    HPI [name] is a 51 y.o. female.  The history is provided by the patient.  Chest Pain   This is a new problem. The current episode started 6 to 12 hours ago. The problem has been resolved. Pain location: left Chest. The pain is mild. The quality of the pain is described as sharp and brief. The pain does not radiate. Pertinent negatives include no abdominal pain, no cough, no diaphoresis, no hemoptysis, no lower extremity edema, no nausea and no shortness of breath. Treatments tried: Aspirin. The treatment provided mild relief. Risk factors include obesity.  Her past medical history is significant for hypertension.  Pertinent negatives for past medical history include no CAD, no PE and no strokes.  Patient with history of obesity, hypertension presents with episode of chest pain She reports she had 3 episodes of brief chest pain that was sharp, non pleuritic While she is awaiting the emergency department she has had no pain Denies history of MI She had no associated symptoms She feels now she is at baseline  Past Medical History:  Diagnosis Date  . Hypertension     Patient Active Problem List   Diagnosis Date Noted  . Abnormal EKG 01/29/2015  . BMI 45.0-49.9, adult (HCC) 07/16/2014  . HTN (hypertension) 01/28/2013  . Breast discharge 01/04/2013  . Axillary lump 02/10/2012  . Abnormal perimenopausal bleeding 12/12/2011    Past Surgical History:  Procedure Laterality Date  . CESAREAN SECTION    . DILATION AND CURETTAGE OF UTERUS    . TUBAL LIGATION      OB History    Gravida Para Term Preterm AB Living   5 2 2   3 2    SAB TAB Ectopic Multiple Live Births     3             Home Medications    Prior to Admission medications   Medication Sig Start  Date End Date Taking? Authorizing Provider  hydrochlorothiazide (HYDRODIURIL) 25 MG tablet Take 25 mg by mouth daily.   Yes [provider]  Multiple Vitamin (MULTIVITAMIN WITH MINERALS) TABS tablet Take 1 tablet by mouth daily.   Yes [provider]  potassium chloride (K-DUR) 10 MEQ tablet Take 10 mEq by mouth daily. 02/02/17  Yes [provider]    Family History Family History  Problem Relation Age of Onset  . Hypertension Mother   . Lung cancer Sister     Social History Social History   Tobacco Use  . Smoking status: Never Smoker  . Smokeless tobacco: Never Used  Substance Use Topics  . Alcohol use: No    Alcohol/week: 0.0 oz  . Drug use: No     Allergies   Patient has no known allergies.   Review of Systems Review of Systems  Constitutional: Negative for diaphoresis.  Respiratory: Negative for cough, hemoptysis and shortness of breath.   Cardiovascular: Positive for chest pain.  Gastrointestinal: Negative for abdominal pain and nausea.  Neurological: Negative for syncope.  All other systems reviewed and are negative.    Physical Exam Updated Vital Signs BP 126/70 (BP Location: Left Arm)   Pulse 82   Temp 97.9 F (36.6 C) (Oral)   Resp 20   Ht 1.651 m (5\' 5" )  Wt 127 kg (280 lb)   SpO2 99%   BMI 46.59 kg/m   Physical Exam  CONSTITUTIONAL: Well developed/well nourished HEAD: Normocephalic/atraumatic EYES: EOMI/PERRL ENMT: Mucous membranes moist NECK: supple no meningeal signs SPINE/BACK:entire spine nontender CV: S1/S2 noted, no murmurs/rubs/gallops noted LUNGS: Lungs are clear to auscultation bilaterally, no apparent distress ABDOMEN: soft, nontender, no rebound or guarding, bowel sounds noted throughout abdomen GU:no cva tenderness NEURO: Pt is awake/alert/appropriate, moves all extremitiesx4.  No facial droop.   EXTREMITIES: pulses normal/equal, full ROM SKIN: warm, color normal PSYCH: no abnormalities of mood  noted, alert and oriented to situation   ED Treatments / Results  Labs (all labs ordered are listed, but only abnormal results are displayed) Labs Reviewed  BASIC METABOLIC PANEL - Abnormal; Notable for the following components:      Result Value   Glucose, Bld 129 (*)    All other components within normal limits  CBC  I-STAT TROPONIN, ED    EKG  EKG Interpretation  Date/Time:  Tuesday March 17 2017 17:04:07 EST Ventricular Rate:  85 PR Interval:    QRS Duration: 88 QT Interval:  341 QTC Calculation: 406 R Axis:   38 Text Interpretation:  Sinus rhythm RSR' in V1 or V2, right VCD or RVH Nonspecific T abnormalities, lateral leads Confirmed by Zadie RhineWickline, Jakel Alphin ([id number]) on 03/18/2017 12:39:37 AM       Radiology Dg Chest 2 View  Result Date: 03/17/2017 CLINICAL DATA:  Chest pain. EXAM: CHEST  2 VIEW COMPARISON:  Radiographs of October 31, 2014. FINDINGS: The heart size and mediastinal contours are within normal limits. Both lungs are clear. No pneumothorax or pleural effusion is noted. The visualized skeletal structures are unremarkable. IMPRESSION: No active cardiopulmonary disease. Electronically Signed   By: Lupita RaiderJames  Green Jr, M.D.   On: 03/17/2017 18:13    Procedures Procedures (including critical care time)  Medications Ordered in ED Medications - No data to display   Initial Impression / Assessment and Plan / ED Course  I have reviewed the triage vital signs and the nursing notes.  Pertinent labs & imaging results that were available during my care of the patient were reviewed by me and considered in my medical decision making (see chart for details).     Heart score is 3 She has been pain-free for several hours She reports the pain was very brief only for a few seconds Denies pleuritic pain I doubt ACS, PE, dissection Will discharge patient home We discussed strict ER return precautions Final Clinical Impressions(s) / ED Diagnoses   Final diagnoses:    Precordial pain    ED Discharge Orders    None       Zadie RhineWickline, Glynna Failla, MD 03/18/17 [phone]

## 2017-03-30 ENCOUNTER — Other Ambulatory Visit: Payer: Self-pay | Admitting: Surgical Oncology

## 2017-03-30 DIAGNOSIS — K219 Gastro-esophageal reflux disease without esophagitis: Secondary | ICD-10-CM

## 2017-04-09 ENCOUNTER — Other Ambulatory Visit: Payer: Self-pay | Admitting: Surgical Oncology

## 2017-04-09 ENCOUNTER — Ambulatory Visit
Admission: RE | Admit: 2017-04-09 | Discharge: 2017-04-09 | Disposition: A | Discharge status: Home / Self Care | Payer: 59 | Source: Ambulatory Visit | Attending: Surgical Oncology | Admitting: Surgical Oncology

## 2017-04-09 ENCOUNTER — Other Ambulatory Visit: Payer: Self-pay

## 2017-04-09 DIAGNOSIS — K219 Gastro-esophageal reflux disease without esophagitis: Secondary | ICD-10-CM

## 2017-06-07 IMAGING — CR DG CHEST 2V
2 series · 2 of 2 positions shown · non-contrast
Comparison: 05/26/2006

CLINICAL DATA: Chest pain, left shoulder pain, nausea.

EXAM:
CHEST  2 VIEW

[chest pa]
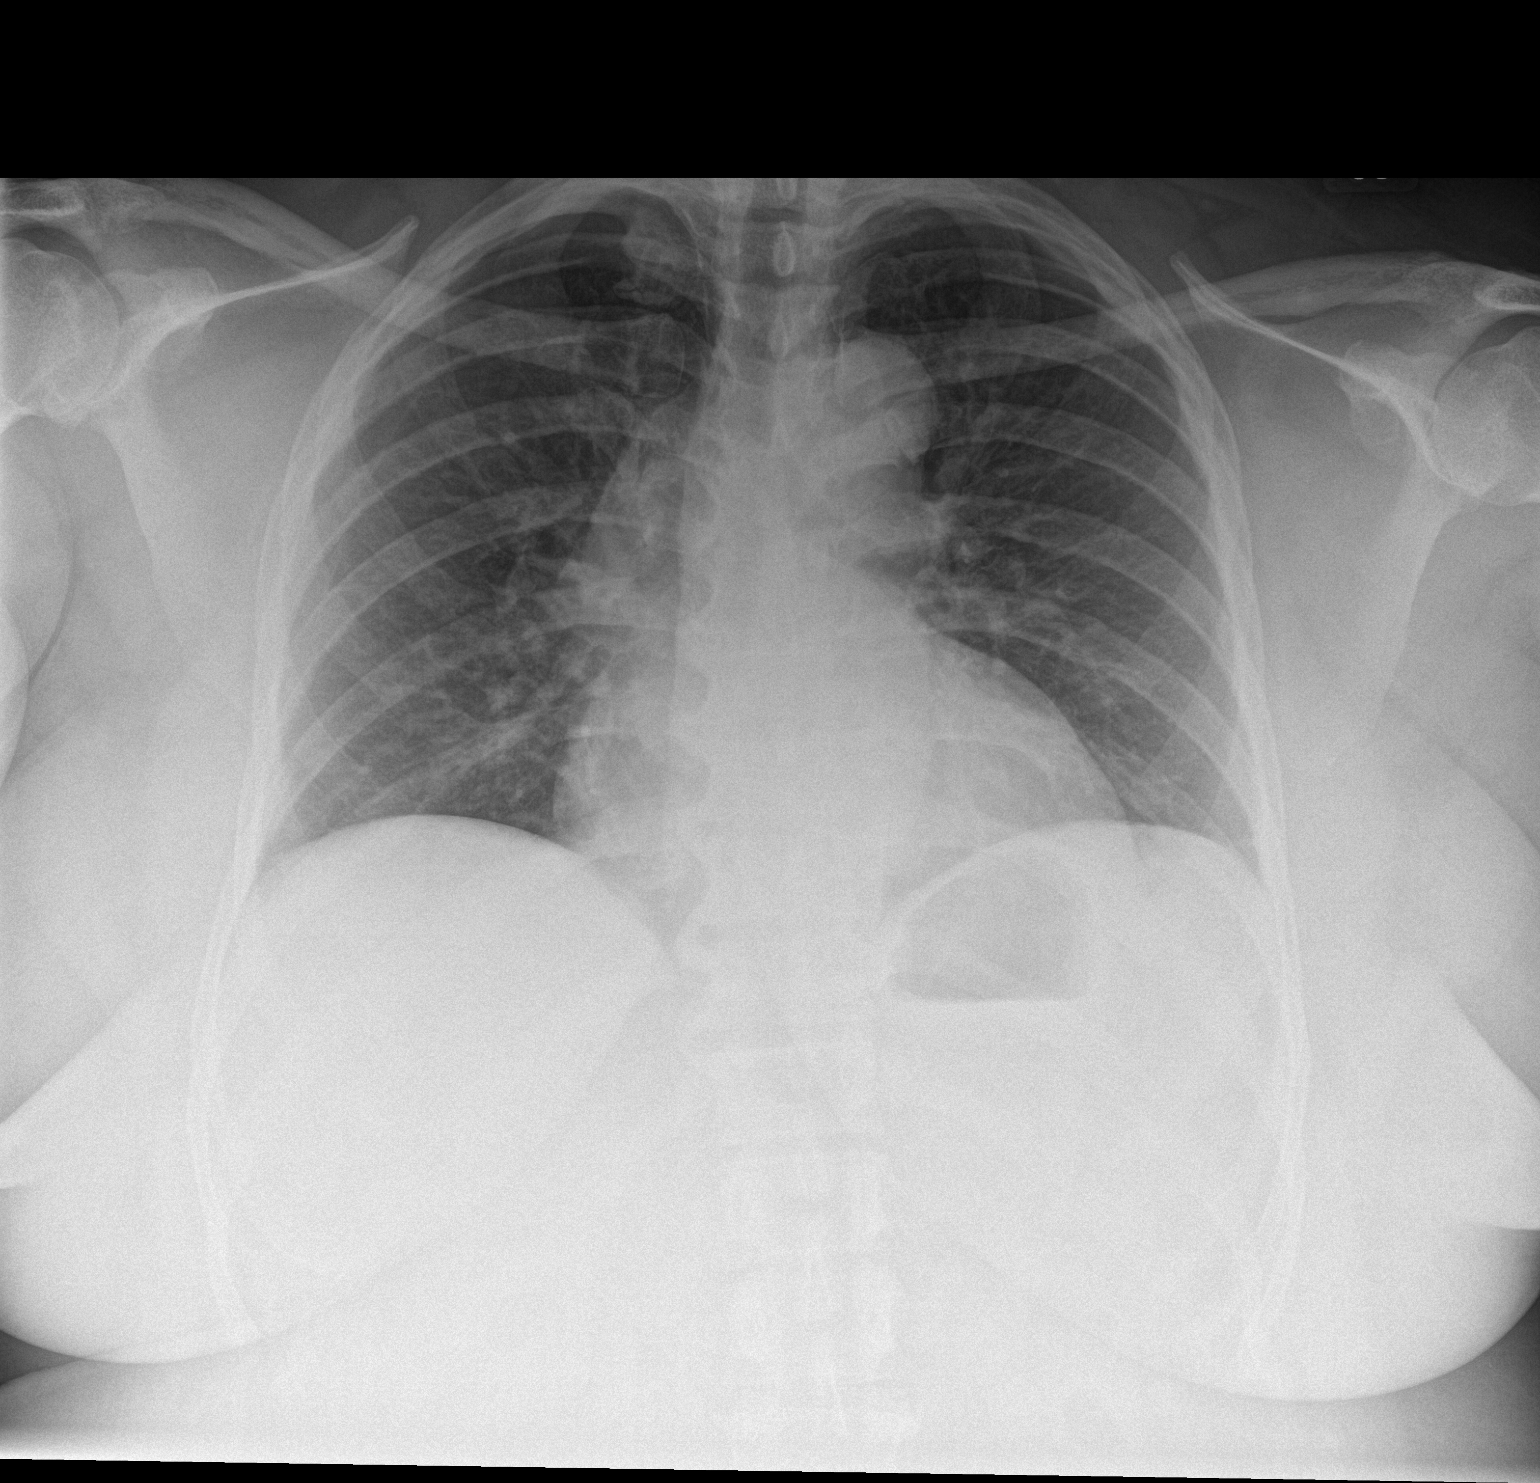

[chest lat]
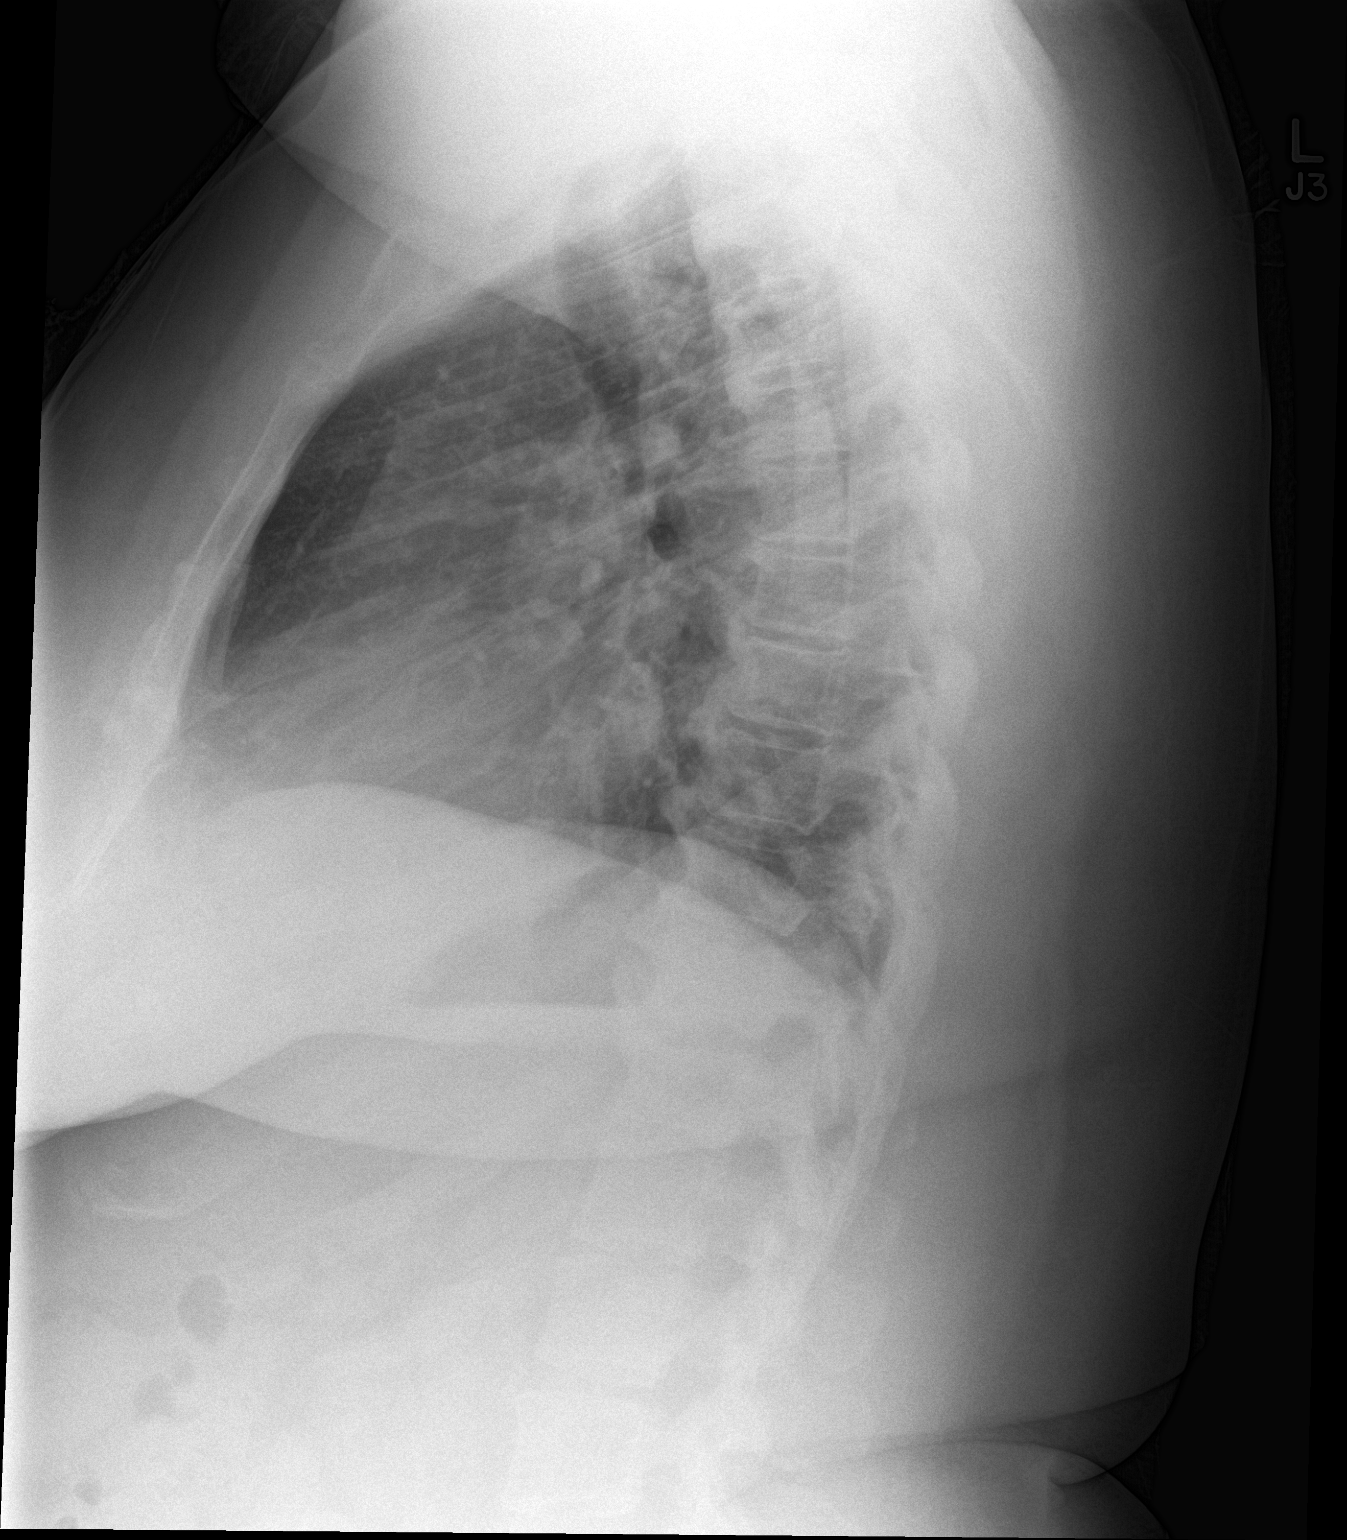

[2 of 2 positions shown; findings below may reference images not displayed]

FINDINGS: Low lung volumes. Heart and mediastinal contours are within normal
limits. No focal opacities or effusions. No acute bony abnormality.
IMPRESSION: No active cardiopulmonary disease.

## 2017-09-12 DEATH — deceased
